# Patient Record
Sex: Female | Born: 2000 | Race: White | Hispanic: No | Marital: Single | Smoking: Current every day smoker
Health system: Southern US, Community
[De-identification: ages and names within clinical notes are randomized; demographics above are authoritative.]

## PROBLEM LIST (undated history)

## (undated) ENCOUNTER — Inpatient Hospital Stay: Payer: Self-pay

## (undated) DIAGNOSIS — Z789 Other specified health status: Secondary | ICD-10-CM

## (undated) DIAGNOSIS — T4145XA Adverse effect of unspecified anesthetic, initial encounter: Secondary | ICD-10-CM

## (undated) DIAGNOSIS — R519 Headache, unspecified: Secondary | ICD-10-CM

## (undated) DIAGNOSIS — R112 Nausea with vomiting, unspecified: Secondary | ICD-10-CM

## (undated) DIAGNOSIS — O039 Complete or unspecified spontaneous abortion without complication: Secondary | ICD-10-CM

## (undated) DIAGNOSIS — T8859XA Other complications of anesthesia, initial encounter: Secondary | ICD-10-CM

## (undated) DIAGNOSIS — K219 Gastro-esophageal reflux disease without esophagitis: Secondary | ICD-10-CM

## (undated) DIAGNOSIS — Z9889 Other specified postprocedural states: Secondary | ICD-10-CM

## (undated) DIAGNOSIS — F419 Anxiety disorder, unspecified: Secondary | ICD-10-CM

## (undated) HISTORY — PX: DILATION AND CURETTAGE OF UTERUS: SHX78

## (undated) HISTORY — PX: OTHER SURGICAL HISTORY: SHX169

## (undated) HISTORY — DX: Complete or unspecified spontaneous abortion without complication: O03.9

---
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
## 2018-04-05 ENCOUNTER — Other Ambulatory Visit: Payer: Self-pay

## 2018-04-05 ENCOUNTER — Emergency Department: Payer: Medicaid Other

## 2018-04-05 ENCOUNTER — Inpatient Hospital Stay
Admission: EM | Admit: 2018-04-05 | Discharge: 2018-04-06 | DRG: 833 | Disposition: A | Discharge status: Home / Self Care | Payer: Medicaid Other | Attending: Obstetrics and Gynecology | Admitting: Obstetrics and Gynecology

## 2018-04-05 DIAGNOSIS — M545 Low back pain: Secondary | ICD-10-CM

## 2018-04-05 DIAGNOSIS — O2301 Infections of kidney in pregnancy, first trimester: Secondary | ICD-10-CM

## 2018-04-05 DIAGNOSIS — O219 Vomiting of pregnancy, unspecified: Secondary | ICD-10-CM | POA: Diagnosis present

## 2018-04-05 DIAGNOSIS — O26891 Other specified pregnancy related conditions, first trimester: Secondary | ICD-10-CM

## 2018-04-05 DIAGNOSIS — N12 Tubulo-interstitial nephritis, not specified as acute or chronic: Secondary | ICD-10-CM | POA: Diagnosis present

## 2018-04-05 DIAGNOSIS — O99331 Smoking (tobacco) complicating pregnancy, first trimester: Secondary | ICD-10-CM | POA: Diagnosis present

## 2018-04-05 DIAGNOSIS — Z3A13 13 weeks gestation of pregnancy: Secondary | ICD-10-CM | POA: Diagnosis not present

## 2018-04-05 DIAGNOSIS — R109 Unspecified abdominal pain: Secondary | ICD-10-CM

## 2018-04-05 DIAGNOSIS — Z79899 Other long term (current) drug therapy: Secondary | ICD-10-CM

## 2018-04-05 DIAGNOSIS — O0990 Supervision of high risk pregnancy, unspecified, unspecified trimester: Secondary | ICD-10-CM

## 2018-04-05 DIAGNOSIS — O0992 Supervision of high risk pregnancy, unspecified, second trimester: Secondary | ICD-10-CM

## 2018-04-05 DIAGNOSIS — Z3A12 12 weeks gestation of pregnancy: Secondary | ICD-10-CM

## 2018-04-05 LAB — BASIC METABOLIC PANEL
Anion gap: 10 (ref 5–15)
BUN: 7 mg/dL (ref 6–20)
CO2: 22 mmol/L (ref 22–32)
Calcium: 9.2 mg/dL (ref 8.9–10.3)
Chloride: 103 mmol/L (ref 98–111)
Creatinine, Ser: 0.54 mg/dL (ref 0.44–1.00)
GFR calc Af Amer: >60 mL/min (ref >60)
GFR calc non Af Amer: >60 mL/min (ref >60)
Glucose, Bld: 116 mg/dL — ABNORMAL HIGH (ref 70–99)
POTASSIUM: 3.9 mmol/L (ref 3.5–5.1)
Sodium: 135 mmol/L (ref 135–145)

## 2018-04-05 LAB — URINALYSIS, COMPLETE (UACMP) WITH MICROSCOPIC
Bilirubin Urine: NEGATIVE
Glucose, UA: NEGATIVE mg/dL
Ketones, ur: 20 mg/dL — AB
Nitrite: POSITIVE — AB
Protein, ur: 100 mg/dL — AB
RBC / HPF: >50 RBC/hpf — ABNORMAL HIGH (ref 0–5)
Specific Gravity, Urine: 1.019 (ref 1.005–1.030)
WBC, UA: >50 WBC/hpf — ABNORMAL HIGH (ref 0–5)
pH: 5 (ref 5.0–8.0)

## 2018-04-05 LAB — TROPONIN I: Troponin I: <0.03 ng/mL (ref <0.03)

## 2018-04-05 LAB — LACTIC ACID, PLASMA: Lactic Acid, Venous: 1.2 mmol/L (ref 0.5–1.9)

## 2018-04-05 LAB — HEPATIC FUNCTION PANEL
ALT: 23 U/L (ref 0–44)
AST: 28 U/L (ref 15–41)
Albumin: 4 g/dL (ref 3.5–5.0)
Alkaline Phosphatase: 87 U/L (ref 38–126)
BILIRUBIN TOTAL: 0.6 mg/dL (ref 0.3–1.2)
Bilirubin, Direct: <0.1 mg/dL (ref 0.0–0.2)
Total Protein: 7.3 g/dL (ref 6.5–8.1)

## 2018-04-05 LAB — CBC
HCT: 37.9 % (ref 36.0–46.0)
HEMOGLOBIN: 13 g/dL (ref 12.0–15.0)
MCH: 27.5 pg (ref 26.0–34.0)
MCHC: 34.3 g/dL (ref 30.0–36.0)
MCV: 80.3 fL (ref 80.0–100.0)
Platelets: 311 10*3/uL (ref 150–400)
RBC: 4.72 MIL/uL (ref 3.87–5.11)
RDW: 14.1 % (ref 11.5–15.5)
WBC: 21.2 10*3/uL — ABNORMAL HIGH (ref 4.0–10.5)
nRBC: 0 % (ref 0.0–0.2)

## 2018-04-05 LAB — LIPASE, BLOOD: Lipase: 25 U/L (ref 11–51)

## 2018-04-05 LAB — HCG, QUANTITATIVE, PREGNANCY: hCG, Beta Chain, Quant, S: 45870 m[IU]/mL — ABNORMAL HIGH (ref <5)

## 2018-04-05 MED ORDER — SODIUM CHLORIDE 0.9 % IV SOLN
1.0000 g | INTRAVENOUS | Status: DC
  Administered 2018-04-06: 1 g via INTRAVENOUS
  Filled 2018-04-05: qty 10
  Filled 2018-04-05: qty 1

## 2018-04-05 MED ORDER — ACETAMINOPHEN 10 MG/ML IV SOLN
1000.0000 mg | Freq: Four times a day (QID) | INTRAVENOUS | Status: DC
  Administered 2018-04-05: 1000 mg via INTRAVENOUS
  Filled 2018-04-05 (×3): qty 100

## 2018-04-05 MED ORDER — MORPHINE SULFATE (PF) 4 MG/ML IV SOLN
4.0000 mg | Freq: Once | INTRAVENOUS | Status: AC
  Administered 2018-04-05: 4 mg via INTRAVENOUS
  Filled 2018-04-05: qty 1

## 2018-04-05 MED ORDER — CEPHALEXIN 500 MG PO CAPS: 500.0000 mg | ORAL_CAPSULE | Freq: Four times a day (QID) | ORAL | 0 refills | Status: DC

## 2018-04-05 MED ORDER — ACETAMINOPHEN 500 MG PO TABS
1000.0000 mg | ORAL_TABLET | Freq: Four times a day (QID) | ORAL | Status: DC | PRN
  Administered 2018-04-05 – 2018-04-06 (×3): 1000 mg via ORAL
  Filled 2018-04-05 (×3): qty 2

## 2018-04-05 MED ORDER — OXYCODONE HCL 5 MG PO TABS
5.0000 mg | ORAL_TABLET | ORAL | Status: DC | PRN
  Administered 2018-04-05: 5 mg via ORAL
  Filled 2018-04-05: qty 1

## 2018-04-05 MED ORDER — SODIUM CHLORIDE 0.9 % IV BOLUS
1000.0000 mL | Freq: Once | INTRAVENOUS | Status: AC
  Administered 2018-04-05: 1000 mL via INTRAVENOUS

## 2018-04-05 MED ORDER — PRENATAL MULTIVITAMIN CH
1.0000 | ORAL_TABLET | Freq: Every day | ORAL | Status: DC
  Administered 2018-04-05: 1 via ORAL
  Filled 2018-04-05 (×2): qty 1

## 2018-04-05 MED ORDER — SODIUM CHLORIDE 0.9% FLUSH
3.0000 mL | Freq: Two times a day (BID) | INTRAVENOUS | Status: DC
  Administered 2018-04-05 (×2): 3 mL via INTRAVENOUS

## 2018-04-05 MED ORDER — OXYCODONE HCL 5 MG PO TABS: 10.0000 mg | ORAL_TABLET | ORAL | Status: DC | PRN

## 2018-04-05 MED ORDER — SODIUM CHLORIDE 0.9 % IV SOLN
2.0000 g | Freq: Once | INTRAVENOUS | Status: AC
  Administered 2018-04-05: 2 g via INTRAVENOUS
  Filled 2018-04-05: qty 20

## 2018-04-05 MED ORDER — HYDROCODONE-ACETAMINOPHEN 5-325 MG PO TABS: 1.0000 | ORAL_TABLET | Freq: Four times a day (QID) | ORAL | 0 refills | Status: DC | PRN

## 2018-04-05 NOTE — ED Provider Notes (Signed)
IMPRESSION: Single living IUP demonstrated with estimated gestational age of [redacted] weeks and 5 days.  No acute maternal findings. IMPRESSION: Normal renal sonogram.  Ultrasound findings as dictated above.  Patient did have recurrence in her pain.  I will discuss with the hospitalist for admission.   Emily FilbertWilliams, Jonathan E, MD 04/05/18 [phone]

## 2018-04-05 NOTE — Discharge Instructions (Signed)
Today your urine test shows that you have an infection in your right kidney.  It is very easy to become quite dehydrated with this infection so make sure you drink lots of fluids throughout the day and take all of your antibiotics as prescribed.  Please make an appointment to follow-up with both [facility] as well as primary care this coming week for recheck and return to the emergency department sooner for any concerns.  It was a pleasure to take care of you today, and thank you for coming to our emergency department.  If you have any questions or concerns before leaving please ask the nurse to grab me and I'm more than happy to go through your aftercare instructions again.  If you were prescribed any opioid pain medication today such as Norco, Vicodin, Percocet, morphine, hydrocodone, or oxycodone please make sure you do not drive when you are taking this medication as it can alter your ability to drive safely.  If you have any concerns once you are home that you are not improving or are in fact getting worse before you can make it to your follow-up appointment, please do not hesitate to call 911 and come back for further evaluation.  Merrily BrittleNeil Orene Abbasi, MD  Results for orders placed or performed during the hospital encounter of 04/05/18  Basic metabolic panel  Result Value Ref Range   Sodium 135 135 - 145 mmol/L   Potassium 3.9 3.5 - 5.1 mmol/L   Chloride 103 98 - 111 mmol/L   CO2 22 22 - 32 mmol/L   Glucose, Bld 116 (H) 70 - 99 mg/dL   BUN 7 6 - 20 mg/dL   Creatinine, Ser 1.910.54 0.44 - 1.00 mg/dL   Calcium 9.2 8.9 - 47.810.3 mg/dL   GFR calc non Af Amer >60 >60 mL/min   GFR calc Af Amer >60 >60 mL/min   Anion gap 10 5 - 15  CBC  Result Value Ref Range   WBC 21.2 (H) 4.0 - 10.5 K/uL   RBC 4.72 3.87 - 5.11 MIL/uL   Hemoglobin 13.0 12.0 - 15.0 g/dL   HCT 29.537.9 62.136.0 - 30.846.0 %   MCV 80.3 80.0 - 100.0 fL   MCH 27.5 26.0 - 34.0 pg   MCHC 34.3 30.0 - 36.0 g/dL   RDW 65.714.1 84.611.5 - 96.215.5 %   Platelets 311 150 - 400 K/uL   nRBC 0.0 0.0 - 0.2 %  Troponin I - ONCE - STAT  Result Value Ref Range   Troponin I <0.03 <0.03 ng/mL  Urinalysis, Complete w Microscopic  Result Value Ref Range   Color, Urine AMBER (A) YELLOW   APPearance CLOUDY (A) CLEAR   Specific Gravity, Urine 1.019 1.005 - 1.030   pH 5.0 5.0 - 8.0   Glucose, UA NEGATIVE NEGATIVE mg/dL   Hgb urine dipstick MODERATE (A) NEGATIVE   Bilirubin Urine NEGATIVE NEGATIVE   Ketones, ur 20 (A) NEGATIVE mg/dL   Protein, ur 952100 (A) NEGATIVE mg/dL   Nitrite POSITIVE (A) NEGATIVE   Leukocytes, UA MODERATE (A) NEGATIVE   RBC / HPF >50 (H) 0 - 5 RBC/hpf   WBC, UA >50 (H) 0 - 5 WBC/hpf   Bacteria, UA RARE (A) NONE SEEN   Squamous Epithelial / LPF 6-10 0 - 5   WBC Clumps PRESENT    Mucus PRESENT    Dg Chest 2 View  Result Date: 04/05/2018 CLINICAL DATA:  Acute onset of right lower back pain radiating to the right lower  quadrant. Nausea and vomiting. Pleuritic chest pain. EXAM: CHEST - 2 VIEW COMPARISON:  None. FINDINGS: The lungs are well-aerated and clear. There is no evidence of focal opacification, pleural effusion or pneumothorax. The heart is normal in size; the mediastinal contour is within normal limits. No acute osseous abnormalities are seen. IMPRESSION: No acute cardiopulmonary process seen. Electronically Signed   By: Roanna RaiderJeffery  Chang M.D.   On: 04/05/2018 04:24

## 2018-04-05 NOTE — ED Provider Notes (Addendum)
[facility] Emergency Department Provider Note  ____________________________________________   First MD Initiated Contact with Patient 04/05/18 [phone]     (approximate)  I have reviewed the triage vital signs and the nursing notes.   HISTORY  Chief Complaint Back Pain   HPI Michelle Nelson is a 17 y.o. female who comes to the emergency department via EMS with sudden onset right low back pain radiating towards her right lower quadrant that began acutely at 8 PM last night.  She has had some nausea and is vomited once.  Mild shortness of breath.  She denies dysuria frequency or hesitancy.  Her symptoms are currently moderate in severity.  They are worse with twisting or movement.  They are somewhat improved with rest.  She recently took a home pregnancy test 2 weeks ago which was positive although she does not know how far along she is.  No history of abdominal surgeries.  She is able to keep food and water down.  No history of nephrolithiasis.  She was not taking fertility medications.   History reviewed. No pertinent past medical history.  Patient Active Problem List   Diagnosis Date Noted  . Supervision of high risk pregnancy, antepartum 04/06/2018  . Pyelonephritis 04/05/2018    Past Surgical History:  Procedure Laterality Date  . CESAREAN SECTION  02/08/2016  . DILATION AND CURETTAGE OF UTERUS      Prior to Admission medications   Medication Sig Start Date End Date Taking? Authorizing Provider  Prenatal Vit-Fe Fumarate-FA (PRENATAL MULTIVITAMIN) TABS tablet Take 1 tablet by mouth daily at 12 noon.   Yes [provider]  cephALEXin (KEFLEX) 500 MG capsule Take 1 capsule (500 mg total) by mouth 4 (four) times daily for 10 days. 04/06/18 04/16/18  Farrel Conners, CNM    Allergies Patient has no known allergies.  No family history on file.  Social History Social History   Tobacco Use  . Smoking status: Current Every Day Smoker  .  Smokeless tobacco: Never Used  Substance Use Topics  . Alcohol use: Not on file  . Drug use: Not on file    Review of Systems Constitutional: No fever/chills Eyes: No visual changes. ENT: No sore throat. Cardiovascular: Denies chest pain. Respiratory: Denies shortness of breath. Gastrointestinal: Positive for abdominal pain.  Positive for nausea, no vomiting.  No diarrhea.  No constipation. Genitourinary: Negative for dysuria. Musculoskeletal: Positive for back pain. Skin: Negative for rash. Neurological: Negative for headaches, focal weakness or numbness.   ____________________________________________   PHYSICAL EXAM:  VITAL SIGNS: ED Triage Vitals  Enc Vitals Group     BP 04/05/18 0322 101/75     Pulse Rate 04/05/18 0322 (!) 111     Resp 04/05/18 0322 18     Temp 04/05/18 0322 97.9 F (36.6 C)     Temp Source 04/05/18 0322 Oral     SpO2 04/05/18 0322 96 %     Weight 04/05/18 0323 168 lb (76.2 kg)     Height 04/05/18 0323 5\' 4"  (1.626 m)     Head Circumference --      Peak Flow --      Pain Score 04/05/18 0322 9     Pain Loc --      Pain Edu? --      Excl. in GC? --     Constitutional: Alert and oriented x4 appears obviously somewhat little although nontoxic in no diaphoresis Eyes: PERRL EOMI. Head: Atraumatic. Nose: No congestion/rhinnorhea. Mouth/Throat: No trismus  Neck: No stridor.   Cardiovascular: Tachycardic rate, regular rhythm. Grossly normal heart sounds.  Good peripheral circulation. Respiratory: Minimally increased respiratory effort.  No retractions. Lungs CTAB and moving good air Gastrointestinal: Soft nondistended she is somewhat tender in her right abdomen although with no rebound no guarding and no peritonitis.  Negative Rovsing's.  Positive costovertebral tenderness on the right.  Negative Murphy's Musculoskeletal: No lower extremity edema   Neurologic:  Normal speech and language. No gross focal neurologic deficits are appreciated. Skin:  Skin  is warm, dry and intact. No rash noted. Psychiatric: Mood and affect are normal. Speech and behavior are normal.    ____________________________________________   DIFFERENTIAL includes but not limited to  Urinary tract infection, pyelonephritis, ectopic pregnancy, appendicitis, infected stone ____________________________________________   LABS (all labs ordered are listed, but only abnormal results are displayed)  Labs Reviewed  URINE CULTURE - Abnormal; Notable for the following components:      Result Value   Culture   (*)    Value: >=100,000 COLONIES/mL ESCHERICHIA COLI SUSCEPTIBILITIES TO FOLLOW Performed at [facility], [address] [place], [place] [id number]    All other components within normal limits  BASIC METABOLIC PANEL - Abnormal; Notable for the following components:   Glucose, Bld 116 (*)    All other components within normal limits  CBC - Abnormal; Notable for the following components:   WBC 21.2 (*)    All other components within normal limits  URINALYSIS, COMPLETE (UACMP) WITH MICROSCOPIC - Abnormal; Notable for the following components:   Color, Urine AMBER (*)    APPearance CLOUDY (*)    Hgb urine dipstick MODERATE (*)    Ketones, ur 20 (*)    Protein, ur 100 (*)    Nitrite POSITIVE (*)    Leukocytes, UA MODERATE (*)    RBC / HPF >50 (*)    WBC, UA >50 (*)    Bacteria, UA RARE (*)    All other components within normal limits  HCG, QUANTITATIVE, PREGNANCY - Abnormal; Notable for the following components:   hCG, Beta Chain, Quant, S 45,870 (*)    All other components within normal limits  CBC - Abnormal; Notable for the following components:   WBC 11.0 (*)    Hemoglobin 11.8 (*)    HCT 34.8 (*)    All other components within normal limits  CULTURE, BLOOD (SINGLE)  TROPONIN I  HEPATIC FUNCTION PANEL  LIPASE, BLOOD  LACTIC ACID, PLASMA    Lab work reviewed by me is consistent with urinary tract infection.  She is pregnant.  Elevated  white count concerning for infection __________________________________________  EKG  ED ECG REPORT I, Merrily BrittleNeil Damisha Wolff, the attending physician, personally viewed and interpreted this ECG.  Date: 04/05/2018 EKG Time:  Rate: 87 Rhythm: normal sinus rhythm QRS Axis: normal Intervals: normal ST/T Wave abnormalities: normal Narrative Interpretation: no evidence of acute ischemia  ____________________________________________  RADIOLOGY  Chest x-ray reviewed by me with no acute disease ____________________________________________   PROCEDURES  Procedure(s) performed: no  Procedures  Critical Care performed: no  ____________________________________________   INITIAL IMPRESSION / ASSESSMENT AND PLAN / ED COURSE  Pertinent labs & imaging results that were available during my care of the patient were reviewed by me and considered in my medical decision making (see chart for details).   As part of my medical decision making, I reviewed the following data within the electronic MEDICAL RECORD NUMBER History obtained from family if available, nursing notes, old  chart and ekg, as well as notes from prior ED visits.  The patient comes to the emergency department uncomfortable appearing with right flank pain radiating towards her groin for the past 8 hours.  She is afebrile and clinically is not septic.  She is somewhat tender in her right abdomen although with no rebound or guarding and her tenderness is not over McBurney's.  She does have exquisite costovertebral tenderness.  My differential right now most notably would be pyelonephritis although I have to keep nephrolithiasis and appendicitis in the differential.  Pregnancy test is pending however given her positive home pregnancy test I am not comfortable CT in the patient.  I discussed with [facility] who recommended an MRI of the abdomen pelvis if I am concerned about appendicitis.  Urinalysis is pending at  this point and if it is negative I do believe she will require an MRI however if it is positive clinically we will treat her as pyelonephritis.  4 mg of IV morphine now for pain control along with a liter of fluids.  Following morphine the patient's pain is significantly improved.  Her urinalysis shows nitrites bacteria white blood cells in clumps all consistent with pyelonephritis.  She has never had resistant bacteria in the past on chart review.  2 g of ceftriaxone and urine culture now and I do anticipate outpatient management with cephalexin.  The patient verbalizes understanding and agreement with the plan.  Clinical Course as of Apr 07 2239  Thu Apr 05, 2018  0808 Patient reports her pain has come back.  I will do an ultrasound and provide further pain medicine.   [JW]    Clinical Course User Index [JW] Emily FilbertWilliams, Jonathan E, MD     ____________________________________________   FINAL CLINICAL IMPRESSION(S) / ED DIAGNOSES  Final diagnoses:  Pyelonephritis      NEW MEDICATIONS STARTED DURING THIS VISIT:  Discharge Medication List as of 04/06/2018  3:12 PM       Note:  This document was prepared using Dragon voice recognition software and may include unintentional dictation errors.    Merrily Brittleifenbark, Nalanie Winiecki, MD 04/05/18 [id number]    Merrily Brittleifenbark, Anyi Fels, MD 04/06/18 2240

## 2018-04-05 NOTE — ED Triage Notes (Addendum)
Pt arrives to ED via [facility] from home with c/o right-sided lower back pain with radiation into the abdominal RLQ x8hrs. Pt reports N/V, but denies diarrhea. No fever; denies any urinary changes or c/o's. Pt also c/o "pain when I breathe". Pt is pregnant but unsure of how far along (found out she was pregnant [redacted] weeks ago). No vaginal bleeding or pregnancy related c/o's.

## 2018-04-05 NOTE — H&P (Signed)
Obstetric H&P   Chief Complaint: Lower back pain, lower abdominal pain  Prenatal Care Provider: None  History of Present Illness: 17 y.o. G1P0 at 12w 5d by Ultrasound today in the ED, presenting for lower back pain on the right side that has been present since last night at 2000. It began to radiate to her right lower quadrant earlier this morning. It worsens when she moves around and improves somewhat with rest. She denies dysuria and suprapubic pressure. She has had nausea of pregnancy; nausea has not increased over the past day. Reports one episode of emesis, no diarrhea. She can tolerate PO food and drink. She has been afebrile. She has not been seen for any prenatal care during this pregnancy.    She notes that her pain is currently only in her right lower back following pain medications given in the ED.  Review of Systems: 10 point review of systems negative unless otherwise noted in HPI.  Past Medical History: History reviewed. No pertinent past medical history.  Past Surgical History: Past Surgical History:  Procedure Laterality Date  . DILATION AND CURETTAGE OF UTERUS      Past Obstetric History: G2P1  Family History: No family history on file.  Social History: Social History   Socioeconomic History  . Marital status: Single    Spouse name: Not on file  . Number of children: Not on file  . Years of education: Not on file  . Highest education level: Not on file  Occupational History  . Not on file  Social Needs  . Financial resource strain: Not on file  . Food insecurity:    Worry: Not on file    Inability: Not on file  . Transportation needs:    Medical: Not on file    Non-medical: Not on file  Tobacco Use  . Smoking status: Current Every Day Smoker  . Smokeless tobacco: Never Used  Substance and Sexual Activity  . Alcohol use: Not on file  . Drug use: Not on file  . Sexual activity: Not on file  Lifestyle  . Physical activity:    Days per week: Not on  file    Minutes per session: Not on file  . Stress: Not on file  Relationships  . Social connections:    Talks on phone: Not on file    Gets together: Not on file    Attends religious service: Not on file    Active member of club or organization: Not on file    Attends meetings of clubs or organizations: Not on file    Relationship status: Not on file  . Intimate partner violence:    Fear of current or ex partner: Not on file    Emotionally abused: Not on file    Physically abused: Not on file    Forced sexual activity: Not on file  Other Topics Concern  . Not on file  Social History Narrative  . Not on file    Medications: Prior to Admission medications   Medication Sig Start Date End Date Taking? Authorizing Provider  Prenatal Vit-Fe Fumarate-FA (PRENATAL MULTIVITAMIN) TABS tablet Take 1 tablet by mouth daily at 12 noon.   Yes [provider]  cephALEXin (KEFLEX) 500 MG capsule Take 1 capsule (500 mg total) by mouth 4 (four) times daily for 10 days. 04/05/18 04/15/18  Merrily Brittleifenbark, Neil, MD  HYDROcodone-acetaminophen (NORCO) 5-325 MG tablet Take 1 tablet by mouth every 6 (six) hours as needed for up to 7 doses for  severe pain. 04/05/18   Merrily Brittle, MD    Allergies: No Known Allergies  Physical Exam: Vitals: Blood pressure 110/63, pulse 62, temperature 98.1 F (36.7 C), temperature source Oral, resp. rate 18, height 5\' 4"  (1.626 m), weight 76.2 kg, last menstrual period 01/01/2018, SpO2 99 %.  FHT: 157 bpm on ultrasound  General: NAD HEENT: normocephalic, anicteric Pulmonary: No increased work of breathing, CTAB Cardiovascular: RRR, no murmurs, rubs, or gallops Abdomen: Non-tender Back: Positive CVAT on right side Genitourinary: Deferred Extremities: No edema, erythema, or tenderness Neurologic: Grossly intact Psychiatric: mood appropriate, affect full  Labs: Results for orders placed or performed during the hospital encounter of 04/05/18 (from the past  24 hour(s))  Basic metabolic panel     Status: Abnormal   Collection Time: 04/05/18  3:29 AM  Result Value Ref Range   Sodium 135 135 - 145 mmol/L   Potassium 3.9 3.5 - 5.1 mmol/L   Chloride 103 98 - 111 mmol/L   CO2 22 22 - 32 mmol/L   Glucose, Bld 116 (H) 70 - 99 mg/dL   BUN 7 6 - 20 mg/dL   Creatinine, Ser 1.61 0.44 - 1.00 mg/dL   Calcium 9.2 8.9 - 09.6 mg/dL   GFR calc non Af Amer >60 >60 mL/min   GFR calc Af Amer >60 >60 mL/min   Anion gap 10 5 - 15  CBC     Status: Abnormal   Collection Time: 04/05/18  3:29 AM  Result Value Ref Range   WBC 21.2 (H) 4.0 - 10.5 K/uL   RBC 4.72 3.87 - 5.11 MIL/uL   Hemoglobin 13.0 12.0 - 15.0 g/dL   HCT 04.5 40.9 - 81.1 %   MCV 80.3 80.0 - 100.0 fL   MCH 27.5 26.0 - 34.0 pg   MCHC 34.3 30.0 - 36.0 g/dL   RDW 91.4 78.2 - 95.6 %   Platelets 311 150 - 400 K/uL   nRBC 0.0 0.0 - 0.2 %  Troponin I - ONCE - STAT     Status: None   Collection Time: 04/05/18  3:29 AM  Result Value Ref Range   Troponin I <0.03 <0.03 ng/mL  hCG, quantitative, pregnancy     Status: Abnormal   Collection Time: 04/05/18  3:29 AM  Result Value Ref Range   hCG, Beta Chain, Quant, S 45,870 (H) <5 mIU/mL  Hepatic function panel     Status: None   Collection Time: 04/05/18  3:29 AM  Result Value Ref Range   Total Protein 7.3 6.5 - 8.1 g/dL   Albumin 4.0 3.5 - 5.0 g/dL   AST 28 15 - 41 U/L   ALT 23 0 - 44 U/L   Alkaline Phosphatase 87 38 - 126 U/L   Total Bilirubin 0.6 0.3 - 1.2 mg/dL   Bilirubin, Direct <2.1 0.0 - 0.2 mg/dL   Indirect Bilirubin NOT CALCULATED 0.3 - 0.9 mg/dL  Lipase, blood     Status: None   Collection Time: 04/05/18  3:29 AM  Result Value Ref Range   Lipase 25 11 - 51 U/L  Urinalysis, Complete w Microscopic     Status: Abnormal   Collection Time: 04/05/18  6:20 AM  Result Value Ref Range   Color, Urine AMBER (A) YELLOW   APPearance CLOUDY (A) CLEAR   Specific Gravity, Urine 1.019 1.005 - 1.030   pH 5.0 5.0 - 8.0   Glucose, UA NEGATIVE  NEGATIVE mg/dL   Hgb urine dipstick MODERATE (A) NEGATIVE  Bilirubin Urine NEGATIVE NEGATIVE   Ketones, ur 20 (A) NEGATIVE mg/dL   Protein, ur 409100 (A) NEGATIVE mg/dL   Nitrite POSITIVE (A) NEGATIVE   Leukocytes, UA MODERATE (A) NEGATIVE   RBC / HPF >50 (H) 0 - 5 RBC/hpf   WBC, UA >50 (H) 0 - 5 WBC/hpf   Bacteria, UA RARE (A) NONE SEEN   Squamous Epithelial / LPF 6-10 0 - 5   WBC Clumps PRESENT    Mucus PRESENT   Lactic acid, plasma     Status: None   Collection Time: 04/05/18  6:20 AM  Result Value Ref Range   Lactic Acid, Venous 1.2 0.5 - 1.9 mmol/L    Assessment: 17 y.o. G1P0 at 12w 5d with likely pyleonephritis. WBC elevated, UA positive for blood, nitrites, and moderate leukocytes. Renal ultrasound is normal.  Plan: 1) Admit for antibiotics and pain management. Has had one dose of ceftriaxone. Continue 1 g ceftriaxone q 24h.  2) Fetus - FHT by Doppler every day.  3) Regular diet, activity as tolerated.  5) Disposition - admit to inpatient.  Marcelyn BruinsJacelyn Schmid, CNM 04/05/2018

## 2018-04-05 NOTE — ED Notes (Signed)
Patient transported to Ultrasound 

## 2018-04-05 NOTE — ED Notes (Signed)
Pt states she is unable to provide a urine sample at this time.  

## 2018-04-06 ENCOUNTER — Encounter: Payer: Self-pay | Admitting: Certified Nurse Midwife

## 2018-04-06 DIAGNOSIS — Z3A13 13 weeks gestation of pregnancy: Secondary | ICD-10-CM

## 2018-04-06 DIAGNOSIS — O0990 Supervision of high risk pregnancy, unspecified, unspecified trimester: Secondary | ICD-10-CM

## 2018-04-06 LAB — CBC
HCT: 34.8 % — ABNORMAL LOW (ref 36.0–46.0)
Hemoglobin: 11.8 g/dL — ABNORMAL LOW (ref 12.0–15.0)
MCH: 27.8 pg (ref 26.0–34.0)
MCHC: 33.9 g/dL (ref 30.0–36.0)
MCV: 82.1 fL (ref 80.0–100.0)
Platelets: 271 10*3/uL (ref 150–400)
RBC: 4.24 MIL/uL (ref 3.87–5.11)
RDW: 14.2 % (ref 11.5–15.5)
WBC: 11 10*3/uL — ABNORMAL HIGH (ref 4.0–10.5)
nRBC: 0 % (ref 0.0–0.2)

## 2018-04-06 MED ORDER — CEPHALEXIN 500 MG PO CAPS: 500.0000 mg | ORAL_CAPSULE | Freq: Four times a day (QID) | ORAL | 0 refills | Status: AC

## 2018-04-06 NOTE — Progress Notes (Signed)
Progress Note:  HD #2  S: Feeling better. Has some pain in the right lower back with some movements, but the pain in her lower abdomen is resolved. Tolerating regular diet.She would like to be discharged.  She is 13 wk 4 days gestation today by her LMP=23 Sept 2019 and EDC=10/08/2018 which is consistent with 12wk5d ultrasound yesterday giving her EDC=10/13/2018.    O: Afebrile. BP 105/70 (BP Location: Right Arm)   Pulse 80   Temp 98 F (36.7 C) (Oral)   Resp 20   Ht 5\' 4"  (1.626 m)   Wt 76.2 kg   LMP 01/01/2018 (Approximate)   SpO2 98%   BMI 28.84 kg/m    General: WF in NAD, sitting up talking Heart: RRR without murmur Lungs: CTAB Back: no CVAT bilaterally  Urine culture: growing >100K E.coli, sensitivities pending WBC on 12/25: 21.1K  A: Right pyelonephritis: improved, pain less on Rocephin IUP at 13wk4day by LMP  P: DC home on Keflex 500 mgm tid x 10 days ([facility]) Advised [facility] next [facility]. Planning on starting prenatal care at [facility], Medicaid still pending  Farrel Connersolleen Desire Fulp, CNM

## 2018-04-06 NOTE — Progress Notes (Signed)
Dc to home.  To car via [facility] staff.  To home via taxi furnished by SW.  Inst to taxi to take across street [facility] to get antibiotic for UTI.  Pt verb u/o of f/u care and [facility].  Address given to pt.  Encouraged to make appt for prenatal care at [facility] also.

## 2018-04-06 NOTE — Progress Notes (Signed)
Talking with pt about possible d/c today.  Pt expressed no way to get home d/t current home situation.  Recently moved from [place] with boyfriend and 19 yr old daughter.  Pt is not in active relationship with her parents currently.  BF works at [employer] on [address] and pt stays home with daughter.  Has been trying to apply for Medicaid.  BF missed work yesterday d/t being in ER and hospital admit, but his employer offered him to work a double shift today so that was her transportation.  Her plan is to walk home.  Nurse notified SW for arrangements for transportation for home.  Taxi voucher received for discharge.   SW also arranged for pt to go [facility] to received antibiotic for home use, which is across street from hospital.

## 2018-04-06 NOTE — Care Management (Signed)
[facility] tubed to 3rd floor for RN to deliver to patient. Referral made to both clinics.

## 2018-04-06 NOTE — Clinical Social Work Note (Signed)
Clinical Social Work Assessment  Patient Details  Name: Michelle Nelson MRN: [id number] Date of Birth: Oct 17, 2000  Date of referral:  04/06/18               Reason for consult:  Care Management Concerns, Insurance Barriers, Transportation                Permission sought to share information with:  Case Optician, dispensing granted to share information::  Yes, Verbal Permission Granted  Name::        Agency::     Relationship::     Contact Information:     Housing/Transportation Living arrangements for the past 2 months:  Apartment Source of Information:  Patient Patient Interpreter Needed:    Criminal Activity/Legal Involvement Pertinent to Current Situation/Hospitalization:  No - Comment as needed Significant Relationships:  Significant Other Lives with:  Significant Other, Minor Children Do you feel safe going back to the place where you live?  Yes Need for family participation in patient care:  Yes (Comment)  Care giving concerns:  Patient lives in [place] in an apartment with her 2 y.o daughter and fiance Michelle Nelson.    Social Worker assessment / plan:  Holiday representative ([name]) received verbal consult from RN that patient needs a ride home today. CSW met with patient alone at bedside to address consult. Patient was alert and oriented X4 and was sitting up in the bed. Patient is [redacted] weeks pregnant and lives in [place] with her fiance and 2 y.o daughter. Per patient she and her fiance do not have car and use uber for transport to the grocery store when they have the funds. Per patient she does not have money to pay for a uber or a taxi today and her fiance is working at his 2 jobs today. Per patient she and her fiance moved to [facility] from [place] in March 19. Per patient her 2 y.o daughter does have medicaid and medicaid transport however patient does not have medicaid. Per patient she has been trying to get medicaid since March 2019. Patient reported that her mother in law is  keeping her 19 year old now while she is at [facility] and is going to take her back to [place] for a few days. CSW provided patient with a taxi voucher to get home today. Per mother she can't afford to pay for her antibiotics. RN case manager provided patient with medication management clinic application. Taxi will take patient to medication management then to her home in [place]. Patient reported that she is going to go to the health department for prenatal care. CSW will continue to follow and assist as needed.   Employment status:  Education officer, museum information:  Self Pay (Medicaid Pending) PT Recommendations:  Not assessed at this time Information / Referral to community resources:  Other (Comment Required)(Taxi Voucher)  Patient/Family's Response to care:  Patient will D/C home today.   Patient/Family's Understanding of and Emotional Response to Diagnosis, Current Treatment, and Prognosis:  Patient was very pleasant and thanked CSW for assistance.   Emotional Assessment Appearance:  Appears stated age Attitude/Demeanor/Rapport:    Affect (typically observed):  Accepting, Adaptable, Pleasant Orientation:  Oriented to Self, Oriented to Place, Oriented to  Time, Oriented to Situation Alcohol / Substance use:  Not Applicable Psych involvement (Current and /or in the community):  No (Comment)  Discharge Needs  Concerns to be addressed:    Readmission within the last 30 days:  No Current discharge risk:  Inadequate Financial  Supports Barriers to Discharge:  No Barriers Identified   Michelle Nelson, Michelle Beets, LCSW 04/06/2018, 2:44 PM

## 2018-04-06 NOTE — Final Progress Note (Signed)
Physician Final Progress Note  Patient ID: Michelle Nelson MRN: [id number] DOB/AGE: 04-13-2000 17 y.o.  Admit date: 04/05/2018 Admitting provider: Conard Novak, MD Discharge date: 04/06/2018   Admission Diagnoses: IUP at 13 weeks with right pyelonephritis  Discharge Diagnoses:  IUP at 13wk4d with right pyelonephritis Consults: None  Significant Findings/ Diagnostic Studies: 17 year old G3 P0111 who presented to the ER with LMP 23 Sept 2019 and EDC=10/08/2018 with sudden onset right lower back pain radiating towards her right lower quadrant. She was tachycardic, but afebrile and denied dysuria. She had some nausea and vomiting and there was right costovertebral tenderness on exam.   Her WBC was 21.2K and her urinalysis was remarkable for +2 protein and was positive for nitrites, WBC clumps, and >50 RBCs. She received 2 gm of IV Rocephin and 4 mgm morphine IV with some relief of her pain. A sonogram confirmed a viable singleton intrauterine pregnancy with CRL consistent with her gestational age by her LMP. A kidney ultrasound was normal. No stones were seen. Because of her recurrence of pain and her nausea and vomiting, she was admitted for IV antibiotics and management of pain.  She was continued on Rocephin. Urine culture grew out >100K E.coli. She was feeling much better after 2 doses of IV Rocephin and was able to tolerate regular diet. Her WBC dropped to 11.0K. She requested discharge on HD #2. She was discharged home on Keflex 500 mgm tid and prenatal vitamins. Follow up appointment was scheduled for next week at [facility]. She plans on starting her prenatal care at [facility] department.  Results for orders placed or performed during the hospital encounter of 04/05/18 (from the past 48 hour(s))  Basic metabolic panel     Status: Abnormal   Collection Time: 04/05/18  3:29 AM  Result Value Ref Range   Sodium 135 135 - 145 mmol/L   Potassium 3.9 3.5 - 5.1 mmol/L   Chloride 103  98 - 111 mmol/L   CO2 22 22 - 32 mmol/L   Glucose, Bld 116 (H) 70 - 99 mg/dL   BUN 7 6 - 20 mg/dL   Creatinine, Ser 4.09 0.44 - 1.00 mg/dL   Calcium 9.2 8.9 - 81.1 mg/dL   GFR calc non Af Amer >60 >60 mL/min   GFR calc Af Amer >60 >60 mL/min   Anion gap 10 5 - 15    Comment: Performed at [facility], [address] [place], [place] [address]  CBC     Status: Abnormal   Collection Time: 04/05/18  3:29 AM  Result Value Ref Range   WBC 21.2 (H) 4.0 - 10.5 K/uL   RBC 4.72 3.87 - 5.11 MIL/uL   Hemoglobin 13.0 12.0 - 15.0 g/dL   HCT 29.5 62.1 - 30.8 %   MCV 80.3 80.0 - 100.0 fL   MCH 27.5 26.0 - 34.0 pg   MCHC 34.3 30.0 - 36.0 g/dL   RDW 65.7 84.6 - 96.2 %   Platelets 311 150 - 400 K/uL   nRBC 0.0 0.0 - 0.2 %    Comment: Performed at [facility], [address] [place], [place] [address]  Troponin I - ONCE - STAT     Status: None   Collection Time: 04/05/18  3:29 AM  Result Value Ref Range   Troponin I <0.03 <0.03 ng/mL    Comment: Performed at [facility], [address] [place], [place] [address]  hCG, quantitative, pregnancy  Status: Abnormal   Collection Time: 04/05/18  3:29 AM  Result Value Ref Range   hCG, Beta Chain, Quant, S 45,870 (H) <5 mIU/mL    Comment:          GEST. AGE      CONC.  (mIU/mL)   <=1 WEEK        5 - 50     2 WEEKS       50 - 500     3 WEEKS       100 - 10,000     4 WEEKS     1,000 - 30,000     5 WEEKS     3,500 - 115,000   6-8 WEEKS     12,000 - 270,000    12 WEEKS     15,000 - 220,000        FEMALE AND NON-PREGNANT FEMALE:     LESS THAN 5 mIU/mL Performed at [facility], [address] [place], [place] [address]   Hepatic function panel     Status: None   Collection Time: 04/05/18  3:29 AM  Result Value Ref Range   Total Protein 7.3 6.5 - 8.1 g/dL   Albumin 4.0 3.5 - 5.0 g/dL   AST 28 15 - 41 U/L   ALT 23 0 - 44 U/L   Alkaline Phosphatase 87 38 - 126 U/L   Total Bilirubin 0.6 0.3 - 1.2 mg/dL    Bilirubin, Direct <2.9 0.0 - 0.2 mg/dL   Indirect Bilirubin NOT CALCULATED 0.3 - 0.9 mg/dL    Comment: Performed at [facility], [address] [place], [place] [address]  Lipase, blood     Status: None   Collection Time: 04/05/18  3:29 AM  Result Value Ref Range   Lipase 25 11 - 51 U/L    Comment: Performed at [facility], [address] [place], [place] [address]  Urinalysis, Complete w Microscopic     Status: Abnormal   Collection Time: 04/05/18  6:20 AM  Result Value Ref Range   Color, Urine AMBER (A) YELLOW    Comment: BIOCHEMICALS MAY BE AFFECTED BY COLOR   APPearance CLOUDY (A) CLEAR   Specific Gravity, Urine 1.019 1.005 - 1.030   pH 5.0 5.0 - 8.0   Glucose, UA NEGATIVE NEGATIVE mg/dL   Hgb urine dipstick MODERATE (A) NEGATIVE   Bilirubin Urine NEGATIVE NEGATIVE   Ketones, ur 20 (A) NEGATIVE mg/dL   Protein, ur 846 (A) NEGATIVE mg/dL   Nitrite POSITIVE (A) NEGATIVE   Leukocytes, UA MODERATE (A) NEGATIVE   RBC / HPF >50 (H) 0 - 5 RBC/hpf   WBC, UA >50 (H) 0 - 5 WBC/hpf   Bacteria, UA RARE (A) NONE SEEN   Squamous Epithelial / LPF 6-10 0 - 5   WBC Clumps PRESENT    Mucus PRESENT     Comment: Performed at [facility], [address] [place], [place] [address]  Lactic acid, plasma     Status: None   Collection Time: 04/05/18  6:20 AM  Result Value Ref Range   Lactic Acid, Venous 1.2 0.5 - 1.9 mmol/L    Comment: Performed at [facility], [address] [place], [place] [address]  Urine culture     Status: Abnormal (Preliminary result)   Collection Time: 04/05/18  6:20 AM  Result Value Ref Range   Specimen Description      URINE, RANDOM Performed at [facility], [address] [place], [place] [address]    Special Requests      NONE Performed at [facility], [address] [place], [place] [address]    Culture (A)     >=100,000 COLONIES/mL ESCHERICHIA COLI SUSCEPTIBILITIES TO FOLLOW Performed  at [facility], [address] [place], [place] [address]    Report Status PENDING   Blood culture (single)     Status: None (Preliminary result)   Collection Time: 04/05/18  6:33 AM  Result Value Ref Range   Specimen Description BLOOD LEFT ANTECUBITAL    Special Requests      BOTTLES DRAWN AEROBIC AND ANAEROBIC Blood Culture adequate volume   Culture      NO GROWTH 1 DAY Performed at [facility], [address] [place], [place] [address]    Report Status PENDING    Procedures: OB ultrasound and kidney ultrasound Dg Chest 2 View  Result Date: 04/05/2018 CLINICAL DATA:  Acute onset of right lower back pain radiating to the right lower quadrant. Nausea and vomiting. Pleuritic chest pain. EXAM: CHEST - 2 VIEW COMPARISON:  None. FINDINGS: The lungs are well-aerated and clear. There is no evidence of focal opacification, pleural effusion or pneumothorax. The heart is normal in size; the mediastinal contour is within normal limits. No acute osseous abnormalities are seen. IMPRESSION: No acute cardiopulmonary process seen. Electronically Signed   By: Roanna RaiderJeffery  Chang M.D.   On: 04/05/2018 04:24   [place] Ob Comp Less 14 Wks  Result Date: 04/05/2018 CLINICAL DATA:  17 year old female with abdominal pain in the 1st trimester of pregnancy. Quantitative beta HCG 45,870. EXAM: OBSTETRIC <14 WK ULTRASOUND TECHNIQUE: Transabdominal ultrasound was performed for evaluation of the gestation as well as the maternal uterus and adnexal regions. COMPARISON:  Renal ultrasound today reported separately. FINDINGS: Intrauterine gestational sac: Single Yolk sac:  Not visible Embryo:  Visible Cardiac Activity: Detected Heart Rate: 157 bpm CRL:   64 mm   12 w 5 d                  US EDC: 10/13/2018 Subchorionic hemorrhage:  None visualized. Maternal uterus/adnexae: The right ovary appears normal measuring 2.3 x 2.5 x 2.7 centimeters. The left ovary appears normal measuring 3.5 x 1.9 by 1.3 centimeters. No  pelvic free fluid. IMPRESSION: Single living IUP demonstrated with estimated gestational age of [redacted] weeks and 5 days. No acute maternal findings. Electronically Signed   By: Odessa FlemingH  Hall M.D.   On: 04/05/2018 09:02   [place] Renal  Result Date: 04/05/2018 CLINICAL DATA:  Flank pain and hematuria EXAM: RENAL / URINARY TRACT ULTRASOUND COMPLETE COMPARISON:  None. FINDINGS: Right Kidney: Renal measurements: 12.2 x 4.0 x 5.2 cm = volume: 133 mL . Echogenicity within normal limits. No mass or hydronephrosis visualized. Left Kidney: Renal measurements: 13.3 x 5.2 x 4.3 cm = volume: 157.2 mL. Echogenicity within normal limits. No mass or hydronephrosis visualized. Bladder: Appears normal for degree of bladder distention. IMPRESSION: Normal renal sonogram. Electronically Signed   By: Signa Kellaylor  Stroud M.D.   On: 04/05/2018 09:01    Discharge Condition: stable  Disposition: Discharge disposition: 01-Home or Self Care       Diet: Regular diet  Discharge Activity: Activity as tolerated  Discharge Instructions    Discharge patient   Complete by:  As directed    Discharge disposition:  01-Home or Self Care   Discharge patient date:  04/06/2018     Allergies as of 04/06/2018  No Known Allergies     Medication List    TAKE these medications   cephALEXin 500 MG capsule Commonly known as:  KEFLEX Take 1 capsule (500 mg total) by mouth 4 (four) times daily for 10 days.   prenatal multivitamin Tabs tablet Take 1 tablet by mouth daily at 12 noon.      Follow-up Information    Center, [facility]. Schedule an appointment as soon as possible for a visit on 04/10/2018.   Specialty:  General Practice Contact information: [address] [place] [address] [phone]        Farrel ConnersGutierrez, Abdias Hickam, CNM . Appointment 04/12/2018 at 3:50 PM at [facility].  Specialty:  Certified Nurse Midwife Contact information: [address] [place]  [address] [phone]             Signed: Farrel ConnersColleen Shernell Saldierna 04/06/2018, 2:55 PM

## 2018-04-07 LAB — URINE CULTURE: Culture: >=100000 — AB

## 2018-04-10 LAB — CULTURE, BLOOD (SINGLE)
Culture: NO GROWTH
Special Requests: ADEQUATE

## 2018-04-12 ENCOUNTER — Encounter: Payer: Self-pay | Admitting: Certified Nurse Midwife

## 2018-04-18 ENCOUNTER — Encounter: Payer: Self-pay | Admitting: Certified Nurse Midwife

## 2018-06-14 ENCOUNTER — Encounter: Payer: Self-pay | Admitting: Advanced Practice Midwife

## 2018-06-14 ENCOUNTER — Other Ambulatory Visit (HOSPITAL_COMMUNITY)
Admission: RE | Admit: 2018-06-14 | Discharge: 2018-06-14 | Disposition: A | Discharge status: Home / Self Care | Payer: Medicaid Other | Source: Ambulatory Visit | Attending: Certified Nurse Midwife | Admitting: Certified Nurse Midwife

## 2018-06-14 ENCOUNTER — Ambulatory Visit (INDEPENDENT_AMBULATORY_CARE_PROVIDER_SITE_OTHER): Payer: Self-pay | Admitting: Advanced Practice Midwife

## 2018-06-14 VITALS — BP 118/74 | Wt 175.0 lb

## 2018-06-14 DIAGNOSIS — Z3480 Encounter for supervision of other normal pregnancy, unspecified trimester: Secondary | ICD-10-CM | POA: Insufficient documentation

## 2018-06-14 DIAGNOSIS — O0932 Supervision of pregnancy with insufficient antenatal care, second trimester: Secondary | ICD-10-CM

## 2018-06-14 DIAGNOSIS — Z113 Encounter for screening for infections with a predominantly sexual mode of transmission: Secondary | ICD-10-CM | POA: Diagnosis present

## 2018-06-14 DIAGNOSIS — Z3A23 23 weeks gestation of pregnancy: Secondary | ICD-10-CM

## 2018-06-14 DIAGNOSIS — O0930 Supervision of pregnancy with insufficient antenatal care, unspecified trimester: Secondary | ICD-10-CM

## 2018-06-14 DIAGNOSIS — Z131 Encounter for screening for diabetes mellitus: Secondary | ICD-10-CM

## 2018-06-14 DIAGNOSIS — O0990 Supervision of high risk pregnancy, unspecified, unspecified trimester: Secondary | ICD-10-CM

## 2018-06-14 DIAGNOSIS — Z130 Encounter for screening for diseases of the blood and blood-forming organs and certain disorders involving the immune mechanism: Secondary | ICD-10-CM

## 2018-06-14 NOTE — Progress Notes (Signed)
NOB, has not had care since 12 weeks at [facility].

## 2018-06-14 NOTE — Patient Instructions (Signed)
Second Trimester of Pregnancy The second trimester is from week 14 through week 27 (months 4 through 6). The second trimester is often a time when you feel your best. Your body has adjusted to being pregnant, and you begin to feel better physically. Usually, morning sickness has lessened or quit completely, you may have more energy, and you may have an increase in appetite. The second trimester is also a time when the fetus is growing rapidly. At the end of the sixth month, the fetus is about 9 inches long and weighs about 1 pounds. You will likely begin to feel the baby move (quickening) between 16 and 20 weeks of pregnancy. Body changes during your second trimester Your body continues to go through many changes during your second trimester. The changes vary from woman to woman.  Your weight will continue to increase. You will notice your lower abdomen bulging out.  You may begin to get stretch marks on your hips, abdomen, and breasts.  You may develop headaches that can be relieved by medicines. The medicines should be approved by your health care provider.  You may urinate more often because the fetus is pressing on your bladder.  You may develop or continue to have heartburn as a result of your pregnancy.  You may develop constipation because certain hormones are causing the muscles that push waste through your intestines to slow down.  You may develop hemorrhoids or swollen, bulging veins (varicose veins).  You may have back pain. This is caused by: ? Weight gain. ? Pregnancy hormones that are relaxing the joints in your pelvis. ? A shift in weight and the muscles that support your balance.  Your breasts will continue to grow and they will continue to become tender.  Your gums may bleed and may be sensitive to brushing and flossing.  Dark spots or blotches (chloasma, mask of pregnancy) may develop on your face. This will likely fade after the baby is born.  A dark line from your  belly button to the pubic area (linea nigra) may appear. This will likely fade after the baby is born.  You may have changes in your hair. These can include thickening of your hair, rapid growth, and changes in texture. Some women also have hair loss during or after pregnancy, or hair that feels dry or thin. Your hair will most likely return to normal after your baby is born. What to expect at prenatal visits During a routine prenatal visit:  You will be weighed to make sure you and the fetus are growing normally.  Your blood pressure will be taken.  Your abdomen will be measured to track your baby's growth.  The fetal heartbeat will be listened to.  Any test results from the previous visit will be discussed. Your health care provider may ask you:  How you are feeling.  If you are feeling the baby move.  If you have had any abnormal symptoms, such as leaking fluid, bleeding, severe headaches, or abdominal cramping.  If you are using any tobacco products, including cigarettes, chewing tobacco, and electronic cigarettes.  If you have any questions. Other tests that may be performed during your second trimester include:  Blood tests that check for: ? Low iron levels (anemia). ? High blood sugar that affects pregnant women (gestational diabetes) between 24 and 28 weeks. ? Rh antibodies. This is to check for a protein on red blood cells (Rh factor).  Urine tests to check for infections, diabetes, or protein in the   urine.  An ultrasound to confirm the proper growth and development of the baby.  An amniocentesis to check for possible genetic problems.  Fetal screens for spina bifida and Down syndrome.  HIV (human immunodeficiency virus) testing. Routine prenatal testing includes screening for HIV, unless you choose not to have this test. Follow these instructions at home: Medicines  Follow your health care provider's instructions regarding medicine use. Specific medicines may be  either safe or unsafe to take during pregnancy.  Take a prenatal vitamin that contains at least 600 micrograms (mcg) of folic acid.  If you develop constipation, try taking a stool softener if your health care provider approves. Eating and drinking   Eat a balanced diet that includes fresh fruits and vegetables, whole grains, good sources of protein such as meat, eggs, or tofu, and low-fat dairy. Your health care provider will help you determine the amount of weight gain that is right for you.  Avoid raw meat and uncooked cheese. These carry germs that can cause birth defects in the baby.  If you have low calcium intake from food, talk to your health care provider about whether you should take a daily calcium supplement.  Limit foods that are high in fat and processed sugars, such as fried and sweet foods.  To prevent constipation: ? Drink enough fluid to keep your urine clear or pale yellow. ? Eat foods that are high in fiber, such as fresh fruits and vegetables, whole grains, and beans. Activity  Exercise only as directed by your health care provider. Most women can continue their usual exercise routine during pregnancy. Try to exercise for 30 minutes at least 5 days a week. Stop exercising if you experience uterine contractions.  Avoid heavy lifting, wear low heel shoes, and practice good posture.  A sexual relationship may be continued unless your health care provider directs you otherwise. Relieving pain and discomfort  Wear a good support bra to prevent discomfort from breast tenderness.  Take warm sitz baths to soothe any pain or discomfort caused by hemorrhoids. Use hemorrhoid cream if your health care provider approves.  Rest with your legs elevated if you have leg cramps or low back pain.  If you develop varicose veins, wear support hose. Elevate your feet for 15 minutes, 3-4 times a day. Limit salt in your diet. Prenatal Care  Write down your questions. Take them to  your prenatal visits.  Keep all your prenatal visits as told by your health care provider. This is important. Safety  Wear your seat belt at all times when driving.  Make a list of emergency phone numbers, including numbers for family, friends, the hospital, and police and fire departments. General instructions  Ask your health care provider for a referral to a local prenatal education class. Begin classes no later than the beginning of month 6 of your pregnancy.  Ask for help if you have counseling or nutritional needs during pregnancy. Your health care provider can offer advice or refer you to specialists for help with various needs.  Do not use hot tubs, steam rooms, or saunas.  Do not douche or use tampons or scented sanitary pads.  Do not cross your legs for long periods of time.  Avoid cat litter boxes and soil used by cats. These carry germs that can cause birth defects in the baby and possibly loss of the fetus by miscarriage or stillbirth.  Avoid all smoking, herbs, alcohol, and unprescribed drugs. Chemicals in these products can affect the formation   and growth of the baby. °· Do not use any products that contain nicotine or tobacco, such as cigarettes and e-cigarettes. If you need help quitting, ask your health care provider. °· Visit your dentist if you have not gone yet during your pregnancy. Use a soft toothbrush to brush your teeth and be gentle when you floss. °Contact a health care provider if: °· You have dizziness. °· You have mild pelvic cramps, pelvic pressure, or nagging pain in the abdominal area. °· You have persistent nausea, vomiting, or diarrhea. °· You have a bad smelling vaginal discharge. °· You have pain when you urinate. °Get help right away if: °· You have a fever. °· You are leaking fluid from your vagina. °· You have spotting or bleeding from your vagina. °· You have severe abdominal cramping or pain. °· You have rapid weight gain or weight loss. °· You have  shortness of breath with chest pain. °· You notice sudden or extreme swelling of your face, hands, ankles, feet, or legs. °· You have not felt your baby move in over an hour. °· You have severe headaches that do not go away when you take medicine. °· You have vision changes. °Summary °· The second trimester is from week 14 through week 27 (months 4 through 6). It is also a time when the fetus is growing rapidly. °· Your body goes through many changes during pregnancy. The changes vary from woman to woman. °· Avoid all smoking, herbs, alcohol, and unprescribed drugs. These chemicals affect the formation and growth your baby. °· Do not use any tobacco products, such as cigarettes, chewing tobacco, and e-cigarettes. If you need help quitting, ask your health care provider. °· Contact your health care provider if you have any questions. Keep all prenatal visits as told by your health care provider. This is important. °This information is not intended to replace advice given to you by your health care provider. Make sure you discuss any questions you have with your health care provider. °Document Released: 03/22/2001 Document Revised: 05/03/2016 Document Reviewed: 05/03/2016 °[employer] Interactive Patient Education © 2019 [employer] °Exercise During Pregnancy °For people of all ages, exercise is an important part of being healthy. Exercise improves heart and lung function and helps to maintain strength, flexibility, and a healthy body weight. Exercise also boosts energy levels and elevates mood. °For most women, maintaining an exercise routine throughout pregnancy is recommended. It is only on rare occasions and with certain medical conditions or pregnancy complications that women may be asked to limit or avoid exercise during pregnancy. °What are some other benefits to exercising during pregnancy? °Along with maintaining strength and flexibility, exercising throughout pregnancy can help to: °· Keep strength in muscles  that are very important during labor and childbirth. °· Decrease low back pain during pregnancy. °· Decrease the risk of developing gestational diabetes mellitus (GDM). °· Improve blood sugar (glucose) control for women who have GDM. °· Decrease the risk of developing preeclampsia. This is a serious condition that causes high blood pressure along with other symptoms, such as swelling and headaches. °· Decrease the risk of cesarean delivery. °· Speed up the recovery after giving birth. °How often should I exercise? °Unless your health care provider gives you different instructions, you should try to exercise on most days or all days of the week. In general, try to exercise with moderate intensity for about 150 minutes per week. This can be spread out across several days, such as exercising for 30 minutes   per day on 5 days of each week. You can tell that you are exercising at a moderate intensity if you have a higher heart rate and faster breathing, but you are still able to hold a conversation. °What types of moderate-intensity exercise are recommended during pregnancy? °There are many types of exercise that are safe for you to do during pregnancy. Unless your health care provider gives you different instructions, do a variety of exercises that safely increase your heart and breathing (cardiopulmonary) rates and help you to build and maintain muscle strength (strength training). You should always be able to talk in full sentences while exercising during pregnancy. °Some examples of exercising that is safe to do during pregnancy include: °· Brisk walking or hiking. °· Swimming. °· Water aerobics. °· Riding a stationary bike. °· Strength training. °· Modified yoga or Pilates. Tell your instructor that you are pregnant. Avoid overstretching and avoid lying on your back for long periods of time. °· Running or jogging. Only choose this type of exercise if: °? You ran or jogged regularly before your pregnancy. °? You can  run or jog and still talk in complete sentences. °What types of exercise should I not do during pregnancy? °Depending on your level of fitness and whether you exercised regularly before your pregnancy, you may be advised to limit vigorous-intensity exercise during your pregnancy. You can tell that you are exercising at a vigorous intensity if you are breathing much harder and faster and cannot hold a conversation while exercising. °Some examples of exercising that you should avoid during pregnancy include: °· Contact sports. °· Activities that place you at risk for falling on or being hit in the belly, such as downhill skiing, water skiing, surfing, rock climbing, cycling, gymnastics, and horseback riding. °· Scuba diving. °· Sky diving. °· Yoga or Pilates in a room that is heated to extreme temperatures ("hot yoga" or "hot Pilates"). °· Jogging or running, unless you ran or jogged regularly before your pregnancy. While jogging or running, you should always be able to talk in full sentences. Do not run or jog so vigorously that you are unable to have a conversation. °· If you are not used to exercising at elevation (more than 6,000 feet above sea level), do not do so during your pregnancy. °When should I avoid exercising during pregnancy? °Certain medical conditions can make it unsafe to exercise during pregnancy, or they may increase your risk of miscarriage or early labor and birth. Some of these conditions include: °· Some types of heart disease. °· Some types of lung disease. °· Placenta previa. This is when the placenta partially or completely covers the opening of the uterus (cervix). °· Frequent bleeding from the vagina during your pregnancy. °· Incompetent cervix. This is when your cervix does not remain as tightly closed during pregnancy as it should. °· Premature labor. °· Ruptured membranes. This is when the protective sac (amniotic sac) opens up and amniotic fluid leaks from your vagina. °· Severely low  blood count (anemia). °· Preeclampsia or pregnancy-caused high blood pressure. °· Carrying more than one baby (multiple gestation) and having an additional risk of early labor. °· Poorly controlled diabetes. °· Being severely underweight or severely overweight. °· Intrauterine growth restriction. This is when your baby's growth and development during pregnancy are slower than expected. °· Other medical conditions. Ask your health care provider if any apply to you. °What else should I know about exercising during pregnancy? °You should take these precautions while exercising during   pregnancy: °· Avoid overheating. °? Wear loose-fitting, breathable clothes. °? Do not exercise in very high temperatures. °· Avoid dehydration. Drink enough water before, during, and after exercise to keep your urine clear or pale yellow. °· Avoid overstretching. Because of hormone changes during pregnancy, it is easy to overstretch muscles, tendons, and ligaments during pregnancy. °· Start slowly and ask your health care provider to recommend types of exercise that are safe for you, if exercising regularly is new for you. °Pregnancy is not a time for exercising to lose weight. °When should I seek medical care? °You should stop exercising and call your health care provider if you have any unusual symptoms, such as: °· Mild uterine contractions or abdominal cramping. °· Dizziness that does not improve with rest. °When should I seek immediate medical care? °You should stop exercising and call your local emergency services (911 in the [place] if you have any unusual symptoms, such as: °· Sudden, severe pain in your low back or your belly. °· Uterine contractions or abdominal cramping that do not improve with rest. °· Chest pain. °· Bleeding or fluid leaking from your vagina. °· Shortness of breath. °This information is not intended to replace advice given to you by your health care provider. Make sure you discuss any questions you have with  your health care provider. °Document Released: 03/28/2005 Document Revised: 08/26/2015 Document Reviewed: 06/05/2014 °[employer] Interactive Patient Education © 2019 [employer] °Eating Plan for Pregnant Women °While you are pregnant, your body requires additional nutrition to help support your growing baby. You also have a higher need for some vitamins and minerals, such as folic acid, calcium, iron, and vitamin D. Eating a healthy, well-balanced diet is very important for your health and your baby's health. Your need for extra calories varies for the three 3-month segments of your pregnancy (trimesters). For most women, it is recommended to consume: °· 150 extra calories a day during the first trimester. °· 300 extra calories a day during the second trimester. °· 300 extra calories a day during the third trimester. °What are tips for following this plan? ° °· Do not try to lose weight or go on a diet during pregnancy. °· Limit your overall intake of foods that have "empty calories." These are foods that have little nutritional value, such as sweets, desserts, candies, and sugar-sweetened beverages. °· Eat a variety of foods (especially fruits and vegetables) to get a full range of vitamins and minerals. °· Take a prenatal vitamin to help meet your additional vitamin and mineral needs during pregnancy, specifically for folic acid, iron, calcium, and vitamin D. °· Remember to stay active. Ask your health care provider what types of exercise and activities are safe for you. °· Practice good food safety and cleanliness. Wash your hands before you eat and after you prepare raw meat. Wash all fruits and vegetables well before peeling or eating. Taking these actions can help to prevent food-borne illnesses that can be very dangerous to your baby, such as listeriosis. Ask your health care provider for more information about listeriosis. °What does 150 extra calories look like? °Healthy options that provide 150 extra  calories each day could be any of the following: °· 6-8 oz (170-230 g) of plain low-fat yogurt with ½ cup of berries. °· 1 apple with 2 teaspoons (11 g) of peanut butter. °· Cut-up vegetables with ¼ cup (60 g) of hummus. °· 8 oz (230 mL) or 1 cup of low-fat chocolate milk. °· 1 stick of string   cheese with 1 medium orange. °· 1 peanut butter and jelly sandwich that is made with one slice of whole-wheat bread and 1 tsp (5 g) of peanut butter. °For 300 extra calories, you could eat two of those healthy options each day. °What is a healthy amount of weight to gain? °The right amount of weight gain for you is based on your BMI before you became pregnant. If your BMI: °· Was less than 18 (underweight), you should gain 28-40 lb (13-18 kg). °· Was 18-24.9 (normal), you should gain 25-35 lb (11-16 kg). °· Was 25-29.9 (overweight), you should gain 15-25 lb (7-11 kg). °· Was 30 or greater (obese), you should gain 11-20 lb (5-9 kg). °What if I am having twins or multiples? °Generally, if you are carrying twins or multiples: °· You may need to eat 300-600 extra calories a day. °· The recommended range for total weight gain is 25-54 lb (11-25 kg), depending on your BMI before pregnancy. °· Talk with your health care provider to find out about nutritional needs, weight gain, and exercise that is right for you. °What foods can I eat? ° °Grains °All grains. Choose whole grains, such as whole-wheat bread, oatmeal, or brown rice. °Vegetables °All vegetables. Eat a variety of colors and types of vegetables. Remember to wash your vegetables well before peeling or eating. °Fruits °All fruits. Eat a variety of colors and types of fruit. Remember to wash your fruits well before peeling or eating. °Meats and other protein foods °Lean meats, including chicken, turkey, fish, and lean cuts of beef, veal, or pork. If you eat fish or seafood, choose options that are higher in omega-3 fatty acids and lower in mercury, such as salmon, herring,  mussels, trout, sardines, pollock, shrimp, crab, and lobster. Tofu. Tempeh. Beans. Eggs. Peanut butter and other nut butters. Make sure that all meats, poultry, and eggs are cooked to food-safe temperatures or "well-done." °Two or more servings of fish are recommended each week in order to get the most benefits from omega-3 fatty acids that are found in seafood. Choose fish that are lower in mercury. You can find more information online: °· www.fda.gov °Dairy °Pasteurized milk and milk alternatives (such as almond milk). Pasteurized yogurt and pasteurized cheese. Cottage cheese. Sour cream. °Beverages °Water. Juices that contain 100% fruit juice or vegetable juice. Caffeine-free teas and decaffeinated coffee. °Drinks that contain caffeine are okay to drink, but it is better to avoid caffeine. Keep your total caffeine intake to less than 200 mg each day (which is 12 oz or 355 mL of coffee, tea, or soda) or the limit as told by your health care provider. °Fats and oils °Fats and oils are okay to include in moderation. °Sweets and desserts °Sweets and desserts are okay to include in moderation. °Seasoning and other foods °All pasteurized condiments. °The items listed above may not be a complete list of recommended foods and beverages. Contact your dietitian for more options. °What foods are not recommended? °Vegetables °Raw (unpasteurized) vegetable juices. °Fruits °Unpasteurized fruit juices. °Meats and other protein foods °Lunch meats, bologna, hot dogs, or other deli meats. (If you must eat those meats, reheat them until they are steaming hot.) Refrigerated paté, meat spreads from a meat counter, smoked seafood that is found in the refrigerated section of a store. Raw or undercooked meats, poultry, and eggs. Raw fish, such as sushi or sashimi. Fish that have high mercury content, such as tilefish, shark, swordfish, and king mackerel. °To learn more about mercury in   fish, talk with your health care provider or look  for online resources, such as: °· www.fda.gov °Dairy °Raw (unpasteurized) milk and any foods that have raw milk in them. Soft cheeses, such as feta, queso blanco, queso fresco, Brie, Camembert cheeses, blue-veined cheeses, and Panela cheese (unless it is made with pasteurized milk, which must be stated on the label). °Beverages °Alcohol. Sugar-sweetened beverages, such as sodas, teas, or energy drinks. °Seasoning and other foods °Homemade fermented foods and drinks, such as pickles, sauerkraut, or kombucha drinks. (Store-bought pasteurized versions of these are okay.) °Salads that are made in a store or deli, such as ham salad, chicken salad, egg salad, tuna salad, and seafood salad. °The items listed above may not be a complete list of foods and beverages to avoid. Contact your dietitian for more information. °Where to find more information °To calculate the number of calories you need based on your height, weight, and activity level, you can use an online calculator such as: °· www.choosemyplate.gov/MyPlatePlan °To calculate how much weight you should gain during pregnancy, you can use an online pregnancy weight gain calculator such as: °· www.choosemyplate.gov/pregnancy-weight-gain-calculator °Summary °· While you are pregnant, your body requires additional nutrition to help support your growing baby. °· Eat a variety of foods, especially fruits and vegetables to get a full range of vitamins and minerals. °· Practice good food safety and cleanliness. Wash your hands before you eat and after you prepare raw meat. Wash all fruits and vegetables well before peeling or eating. Taking these actions can help to prevent food-borne illnesses, such as listeriosis, that can be very dangerous to your baby. °· Do not eat raw meat or fish. Do not eat fish that have high mercury content, such as tilefish, shark, swordfish, and king mackerel. Do not eat unpasteurized (raw) dairy. °· Take a prenatal vitamin to help meet your  additional vitamin and mineral needs during pregnancy, specifically for folic acid, iron, calcium, and vitamin D. °This information is not intended to replace advice given to you by your health care provider. Make sure you discuss any questions you have with your health care provider. °Document Released: 01/10/2014 Document Revised: 12/23/2016 Document Reviewed: 12/23/2016 °[employer] Interactive Patient Education © 2019 [employer] °Prenatal Care °Prenatal care is health care during pregnancy. It helps you and your unborn baby (fetus) stay as healthy as possible. Prenatal care may be provided by a midwife, a family practice health care provider, or a childbirth and pregnancy specialist (obstetrician). °How does this affect me? °During pregnancy, you will be closely monitored for any new conditions that might develop. To lower your risk of pregnancy complications, you and your health care provider will talk about any underlying conditions you have. °How does this affect my baby? °Early and consistent prenatal care increases the chance that your baby will be healthy during pregnancy. Prenatal care lowers the risk that your baby will be: °· Born early (prematurely). °· Smaller than expected at birth (small for gestational age). °What can I expect at the first prenatal care visit? °Your first prenatal care visit will likely be the longest. You should schedule your first prenatal care visit as soon as you know that you are pregnant. Your first visit is a good time to talk about any questions or concerns you have about pregnancy. At your visit, you and your health care provider will talk about: °· Your medical history, including: °? Any past pregnancies. °? Your family's medical history. °? The baby's father's medical history. °? Any long-term (  chronic) health conditions you have and how you manage them. °? Any surgeries or procedures you have had. °? Any current over-the-counter or prescription medicines, herbs, or  supplements you are taking. °· Other factors that could pose a risk to your baby, including: °· Your home setting and your stress levels, including: °? Exposure to abuse or violence. °? Household financial strain. °? Mental health conditions you have. °· Your daily health habits, including diet and exercise. °Your health care provider will also: °· Measure your weight, height, and blood pressure. °· Do a physical exam, including a pelvic and breast exam. °· Perform blood tests and urine tests to check for: °? Urinary tract infection. °? Sexually transmitted infections (STIs). °? Low iron levels in your blood (anemia). °? Blood type and certain proteins on red blood cells (Rh antibodies). °? Infections and immunity to viruses, such as hepatitis B and rubella. °? HIV (human immunodeficiency virus). °· Do an ultrasound to confirm your baby's growth and development and to help predict your estimated due date (EDD). This ultrasound is done with a probe that is inserted into the vagina (transvaginal ultrasound). °· Discuss your options for genetic screening. °· Give you information about how to keep yourself and your baby healthy, including: °? Nutrition and taking vitamins. °? Physical activity. °? How to manage pregnancy symptoms such as nausea and vomiting (morning sickness). °? Infections and substances that may be harmful to your baby and how to avoid them. °? Food safety. °? Dental care. °? Working. °? Travel. °? Warning signs to watch for and when to call your health care provider. °How often will I have prenatal care visits? °After your first prenatal care visit, you will have regular visits throughout your pregnancy. The visit schedule is often as follows: °· Up to week 28 of pregnancy: once every 4 weeks. °· 28-36 weeks: once every 2 weeks. °· After 36 weeks: every week until delivery. °Some women may have visits more or less often depending on any underlying health conditions and the health of the baby. °Keep  all follow-up and prenatal care visits as told by your health care provider. This is important. °What happens during routine prenatal care visits? °Your health care provider will: °· Measure your weight and blood pressure. °· Check for fetal heart sounds. °· Measure the height of your uterus in your abdomen (fundal height). This may be measured starting around week 20 of pregnancy. °· Check the position of your baby inside your uterus. °· Ask questions about your diet, sleeping patterns, and whether you can feel the baby move. °· Review warning signs to watch for and signs of labor. °· Ask about any pregnancy symptoms you are having and how you are dealing with them. Symptoms may include: °? Headaches. °? Nausea and vomiting. °? Vaginal discharge. °? Swelling. °? Fatigue. °? Constipation. °? Any discomfort, including back or pelvic pain. °Make a list of questions to ask your health care provider at your routine visits. °What tests might I have during prenatal care visits? °You may have blood, urine, and imaging tests throughout your pregnancy, such as: °· Urine tests to check for glucose, protein, or signs of infection. °· Glucose tests to check for a form of diabetes that can develop during pregnancy (gestational diabetes mellitus). This is usually done around week 24 of pregnancy. °· An ultrasound to check your baby's growth and development and to check for birth defects. This is usually done around week 20 of pregnancy. °· A test   to check for group B strep (GBS) infection. This is usually done around week 36 of pregnancy. °· Genetic testing. This may include blood or imaging tests, such as an ultrasound. Some genetic tests are done during the first trimester and some are done during the second trimester. °What else can I expect during prenatal care visits? °Your health care provider may recommend getting certain vaccines during pregnancy. These may include: °· A yearly flu shot (annual influenza vaccine). This is  especially important if you will be pregnant during flu season. °· Tdap (tetanus, diphtheria, pertussis) vaccine. Getting this vaccine during pregnancy can protect your baby from whooping cough (pertussis) after birth. This vaccine may be recommended between weeks 27 and 36 of pregnancy. °Later in your pregnancy, your health care provider may give you information about: °· Childbirth and breastfeeding classes. °· Choosing a health care provider for your baby. °· Umbilical cord banking. °· Breastfeeding. °· Birth control after your baby is born. °· The hospital labor and delivery unit and how to tour it. °· Registering at the hospital before you go into labor. °Where to find more information °· Office on Women's Health: womenshealth.gov °· American Pregnancy Association: americanpregnancy.org °· March of Dimes: marchofdimes.org °Summary °· Prenatal care helps you and your baby stay as healthy as possible during pregnancy. °· Your first prenatal care visit will most likely be the longest. °· You will have visits and tests throughout your pregnancy to monitor your health and your baby's health. °· Bring a list of questions to your visits to ask your health care provider. °· Make sure to keep all follow-up and prenatal care visits with your health care provider. °This information is not intended to replace advice given to you by your health care provider. Make sure you discuss any questions you have with your health care provider. °Document Released: 03/31/2003 Document Revised: 03/27/2017 Document Reviewed: 03/27/2017 °Elsevier Interactive Patient Education © 2019 [employer]vier Inc. ° °

## 2018-06-15 DIAGNOSIS — O0930 Supervision of pregnancy with insufficient antenatal care, unspecified trimester: Secondary | ICD-10-CM | POA: Insufficient documentation

## 2018-06-15 NOTE — Progress Notes (Signed)
New Obstetric Patient H&P    Chief Complaint: "Desires prenatal care"   History of Present Illness: Patient is a 18 y.o. [id number] Not Hispanic or Latino female, presents with amenorrhea and positive home pregnancy test. Patient's last menstrual period was 01/01/2018 (approximate). and based on her  LMP, her EDD is Estimated Date of Delivery: 10/08/18 and her EGA is [redacted]w[redacted]d. Cycles are 7. days, regular, and occur approximately every : 28 days. She has never had a PAP smear.   She had a urine pregnancy test which was positive in November. She was seen in the ED in December for pyelonephritis. Her last menstrual period was normal and lasted for  7 day(s). Since her LMP she claims she has experienced breast tenderness, fatigue, nausea, vomiting, frequent urination. She denies vaginal bleeding. Her past medical history is noncontributory. Her prior pregnancies are notable for SAB with D&C, C/S at 35 weeks for fetal distress (Newborn with known gastroschesis)  Since her LMP, she admits to the use of tobacco products  Yes she smokes 2-3 cigarettes per day and is trying to quit She claims she has gained   10 pounds since the start of her pregnancy.  There are cats in the home in the home  no  She admits close contact with children on a regular basis  yes  She has had chicken pox in the past unknown She has had Tuberculosis exposures, symptoms, or previously tested positive for TB   no Current or past history of domestic violence. no  Genetic Screening/Teratology Counseling: (Includes patient, baby's father, or anyone in either family with:)   1. Patient's age >/= 80 at [facility]  no 2. Thalassemia ([place], [place], Mediterranean, or Asian background): MCV<80  no 3. Neural tube defect (meningomyelocele, spina bifida, anencephaly)  no 4. Congenital heart defect  no  5. Down syndrome  Patient's brother with DS 6. Tay-Sachs (Jewish, [place]  no 7. Canavan's Disease  no 8. Sickle cell disease or  trait (African)  no  9. Hemophilia or other blood disorders  no  10. Muscular dystrophy  no  11. Cystic fibrosis  no  12. Huntington's Chorea  no  13. Mental retardation/autism  FOB's uncle with MR 14. Other inherited genetic or chromosomal disorder  no 15. Maternal metabolic disorder (DM, PKU, etc)  no 16. Patient or FOB with a child with a birth defect not listed above no  16a. Patient or FOB with a birth defect themselves no 17. Recurrent pregnancy loss, or stillbirth  no  18. Any medications since LMP other than prenatal vitamins (include vitamins, supplements, OTC meds, drugs, alcohol)  no 19. Any other genetic/environmental exposure to discuss  no  Infection History:   1. Lives with someone with TB or TB exposed  no  2. Patient or partner has history of genital herpes  no 3. Rash or viral illness since LMP  no 4. History of STI (GC, CT, HPV, syphilis, HIV)  no 5. History of recent travel :  no  Other pertinent information:  Late entry to care     Review of Systems:10 point review of systems negative unless otherwise noted in HPI  Past Medical History:  Past Medical History:  Diagnosis Date  . Miscarriage     Past Surgical History:  Past Surgical History:  Procedure Laterality Date  . CESAREAN SECTION  02/08/2016  . DILATION AND CURETTAGE OF UTERUS      Gynecologic History: Patient's last menstrual period was 01/01/2018 (approximate).  Obstetric  History: [id number]  Family History:  Family History  Problem Relation Age of Onset  . Heart disease Paternal Grandmother     Social History:  Social History   Socioeconomic History  . Marital status: Single    Spouse name: Not on file  . Number of children: Not on file  . Years of education: Not on file  . Highest education level: Not on file  Occupational History  . Not on file  Social Needs  . Financial resource strain: Not on file  . Food insecurity:    Worry: Not on file    Inability: Not on file  .  Transportation needs:    Medical: Not on file    Non-medical: Not on file  Tobacco Use  . Smoking status: Current Every Day Smoker  . Smokeless tobacco: Never Used  Substance and Sexual Activity  . Alcohol use: Not Currently  . Drug use: Not Currently  . Sexual activity: Yes    Birth control/protection: None  Lifestyle  . Physical activity:    Days per week: Not on file    Minutes per session: Not on file  . Stress: Not on file  Relationships  . Social connections:    Talks on phone: Not on file    Gets together: Not on file    Attends religious service: Not on file    Active member of club or organization: Not on file    Attends meetings of clubs or organizations: Not on file    Relationship status: Not on file  . Intimate partner violence:    Fear of current or ex partner: Not on file    Emotionally abused: Not on file    Physically abused: Not on file    Forced sexual activity: Not on file  Other Topics Concern  . Not on file  Social History Narrative  . Not on file    Allergies:  No Known Allergies  Medications: Prior to Admission medications   Medication Sig Start Date End Date Taking? Authorizing Provider  Prenatal Vit-Fe Fumarate-FA (PRENATAL MULTIVITAMIN) TABS tablet Take 1 tablet by mouth daily at 12 noon.   Yes [provider]    Physical Exam Vitals: Blood pressure 118/74, weight 175 lb (79.4 kg), last menstrual period 01/01/2018.  General: NAD HEENT: normocephalic, anicteric Thyroid: no enlargement, no palpable nodules Pulmonary: No increased work of breathing, CTAB Cardiovascular: RRR, distal pulses 2+ Abdomen: NABS, soft, non-tender, non-distended.  Umbilicus without lesions.  No hepatomegaly, splenomegaly or masses palpable. No evidence of hernia, FHTs 140s  Genitourinary: deferred for no concerns/No PAP/abdominal FHTs Extremities: no edema, erythema, or tenderness Neurologic: Grossly intact Psychiatric: mood appropriate, affect  full   Assessment: 18 y.o. [id number] at [redacted]w[redacted]d by LMP presenting to initiate prenatal care  Plan: 1) Avoid alcoholic beverages. 2) Patient encouraged not to smoke.  3) Discontinue the use of all non-medicinal drugs and chemicals.  4) Take prenatal vitamins daily.  5) Nutrition, food safety (fish, cheese advisories, and high nitrite foods) and exercise discussed. 6) Hospital and practice style discussed with cross coverage system.  7) Genetic Screening, such as with 1st Trimester Screening, cell free fetal DNA, AFP testing, and Ultrasound, as well as with amniocentesis and CVS as appropriate, is discussed with patient. At the conclusion of today's visit patient requested cell free DNA genetic testing 8) Patient is asked about travel to areas at risk for the Zika virus, and counseled to avoid travel and exposure to mosquitoes or sexual partners who  may have themselves been exposed to the virus. Testing is discussed, and will be ordered as appropriate.  9) Urine aptima and culture today 10) Anatomy scan at [facility] and RTC in 1 week for ROB   Tresea Mall, CNM [facility], [facility] 06/15/2018, 8:29 AM

## 2018-06-16 LAB — URINE CULTURE: Organism ID, Bacteria: NO GROWTH

## 2018-06-18 LAB — CERVICOVAGINAL ANCILLARY ONLY
CHLAMYDIA, DNA PROBE: NEGATIVE
Neisseria Gonorrhea: NEGATIVE
Trichomonas: NEGATIVE

## 2018-06-25 ENCOUNTER — Other Ambulatory Visit: Payer: Self-pay

## 2018-06-25 ENCOUNTER — Ambulatory Visit
Admission: RE | Admit: 2018-06-25 | Discharge: 2018-06-25 | Disposition: A | Discharge status: Home / Self Care | Payer: Medicaid Other | Source: Ambulatory Visit | Attending: Advanced Practice Midwife | Admitting: Advanced Practice Midwife

## 2018-06-25 DIAGNOSIS — Z3480 Encounter for supervision of other normal pregnancy, unspecified trimester: Secondary | ICD-10-CM | POA: Insufficient documentation

## 2018-06-29 ENCOUNTER — Telehealth: Payer: Self-pay

## 2018-06-29 NOTE — Telephone Encounter (Signed)
Spoke with patient and discussed results of ultrasound. She will need follow up for spine and nuchal region.

## 2018-06-29 NOTE — Telephone Encounter (Signed)
Pt calling for u/s results from Monday.  [phone]

## 2018-07-05 NOTE — Discharge Summary (Signed)
See final progress note completed by Farrel Conners, CNM.

## 2018-07-12 ENCOUNTER — Encounter: Payer: Self-pay | Admitting: Maternal Newborn

## 2018-07-12 ENCOUNTER — Other Ambulatory Visit: Payer: Self-pay

## 2018-07-26 ENCOUNTER — Encounter: Payer: Self-pay | Admitting: Maternal Newborn

## 2018-07-26 ENCOUNTER — Other Ambulatory Visit: Payer: Self-pay

## 2018-08-01 ENCOUNTER — Other Ambulatory Visit: Payer: Self-pay

## 2018-08-01 ENCOUNTER — Other Ambulatory Visit: Payer: Medicaid Other

## 2018-08-01 ENCOUNTER — Ambulatory Visit (INDEPENDENT_AMBULATORY_CARE_PROVIDER_SITE_OTHER): Payer: Medicaid Other | Admitting: Obstetrics & Gynecology

## 2018-08-01 VITALS — BP 120/70 | Wt 185.0 lb

## 2018-08-01 DIAGNOSIS — Z113 Encounter for screening for infections with a predominantly sexual mode of transmission: Secondary | ICD-10-CM

## 2018-08-01 DIAGNOSIS — Z23 Encounter for immunization: Secondary | ICD-10-CM

## 2018-08-01 DIAGNOSIS — O0930 Supervision of pregnancy with insufficient antenatal care, unspecified trimester: Secondary | ICD-10-CM

## 2018-08-01 DIAGNOSIS — Z3A30 30 weeks gestation of pregnancy: Secondary | ICD-10-CM

## 2018-08-01 DIAGNOSIS — Z130 Encounter for screening for diseases of the blood and blood-forming organs and certain disorders involving the immune mechanism: Secondary | ICD-10-CM

## 2018-08-01 DIAGNOSIS — O0933 Supervision of pregnancy with insufficient antenatal care, third trimester: Secondary | ICD-10-CM

## 2018-08-01 DIAGNOSIS — Z131 Encounter for screening for diabetes mellitus: Secondary | ICD-10-CM

## 2018-08-01 DIAGNOSIS — Z3480 Encounter for supervision of other normal pregnancy, unspecified trimester: Secondary | ICD-10-CM

## 2018-08-01 NOTE — Patient Instructions (Signed)
 Hello,  Given the current COVID-19 pandemic, our practice is making changes in how we are providing care to our patients. We are limiting in-person visits for the safety of all of our patients.   As a practice, we have met to discuss the best way to minimize visits, but still provide excellent care to our expecting mothers.  We have decided on the following visit structure for low-risk pregnancies.  Initial Pregnancy visit will be conducted as a telephone or web visit.  Between 10-14 weeks  there will be one in-person visit for an ultrasound, lab work, and genetic screening. 20 weeks in-person visit with an anatomy ultrasound  28 weeks in-person office visit for a 1-hour glucose test and a TDAP vaccination 32 weeks in-person office visit 34 weeks telephone visit 36 weeks in-person office visit for GBS, chlamydia, and gonorrhea testing 38 weeks in-person office visit 40 weeks in-person office visit  Understandably, some patients will require more visits than what is outlined above. Additional visits will be determined on a case-by-case basis.   We will, as always, be available for emergencies or to address concerns that might arise between in-person visits. We ask that you allow us the opportunity to address any concerns over the phone or through a virtual visit first. We will be available to return your phone calls throughout the day.   If you are able to purchase a scale, a blood pressure machine, and a home fetal doppler visits could be limited further. This will help decrease your exposure risks, but these purchases are not a necessity.   Things seem to change daily and there is the possibility that this structure could change, please be patient as we adapt to a new way of caring for patients.   Thank you for trusting us with your prenatal care. Our practice values you and looks forward to providing you with excellent care.   Sincerely,   [facility], [facility]      COVID-19 and Your Pregnancy FAQ  How can I prevent infection with COVID-19 during my pregnancy? Social distancing is key. Please limit any interactions in public. Try and work from home if possible. Frequently wash your hands after touching possibly contaminated surfaces. Avoid touching your face.  Minimize trips to the store. Consider online ordering when possible.   Should I wear a mask? YES. It is recommended by the CDC that all people wear a cloth mask or facial covering in public. This will help reduce transmission as well as your risk or acquiring COVID-19. New studies are showing that even asymptomatic individuals can spread the virus from talking.   What are the symptoms of COVID-19? Fever (greater than 100.4 F), dry cough, shortness of breath.  Am I more at risk for COVID-19 since I am pregnant? There is not currently data showing that pregnant women are more adversely impacted by COVID-19 than the general population. However, we know that pregnant women tend to have worse respiratory complications from similar diseases such as the flu and SARS and for this reason should be considered an at-risk population.  What do I do if I am experiencing the symptoms of COVID-19? Testing is being limited because of test availability. If you are experiencing symptoms you should quarantine yourself, and the members of your family, for at least 2 weeks at home.   Please visit this website for more information: https://www.cdc.gov/coronavirus/2019-ncov/if-you-are-sick/steps-when-sick.html  When should I go to the Emergency Room? Please go to the emergency room if   you are experiencing ANY of these symptoms*:  1.    Difficulty breathing or shortness of breath 2.    Persistent pain or pressure in the chest 3.    Confusion or difficulty being aroused (or awakened) 4.    Bluish lips or face  *This list is not all inclusive. Please consult our office for any other symptoms that are severe  or concerning.  What do I do if I am having difficulty breathing? You should go to the Emergency Room for evaluation. At this time they have a tent set up for evaluating patients with COVID-19 symptoms.   How will my prenatal care be different because of the COVID-19 pandemic? It has been recommended to reduce the frequency of face-to-face visits and use resources such as telephone and virtual visits when possible. Using a scale, blood pressure machine and fetal doppler at home can further help reduce face-to-face visits. You will be provided with additional information on this topic.  We ask that you come to your visits alone to minimize potential exposures to  COVID-19.  How can I receive childbirth education? At this time in-person classes have been cancelled. You can register for online childbirth education, breastfeeding, and newborn care classes.  Please visit:  [email or web address] for more information  How will my hospital birth experience be different? The hospital is currently limiting visitors. This means that while you are in labor you can only have one person at the hospital with you. Additional family members will not be allowed to wait in the building or outside your room. Your one support person can be the father of the baby, a relative, a doula, or a friend. Once one support person is designated that person will wear a band. This band cannot be shared with multiple people.  Nitrous Gas is not being offered for pain relief since the tubing and filter for the machine can not be sanitized in a way to guarantee prevention of transmission of COVID-19.  Nasal cannula use of oxygen for fetal indications has also been discontinued.  Currently a clear plastic sheet is being hung between mom and the delivering provider during pushing and delivery to help prevent transmission of COVID-19.      How long will I stay in the hospital for after giving birth? It is also recommended that  discharge home be expedited during the COVID-19 outbreak. This means staying for 1 day after a vaginal delivery and 2 days after a cesarean section. Patients who need to stay longer for medical reasons are allowed to do so, but the goal will be for expedited discharge home.   What if I have COVID-19 and I am in labor? We ask that you wear a mask while on labor and delivery. We will try and accommodate you being placed in a room that is capable of filtering the air. Please call ahead if you are in labor and on your way to the hospital. The phone number for labor and delivery at [facility] is [phone].  If I have COVID-19 when my baby is born how can I prevent my baby from contracting COVID-19? This is an issue that will have to be discussed on a case-by-case basis. Current recommendations suggest providing separate isolation rooms for both the mother and new infant as well as limiting visitors. However, there are practical challenges to this recommendation. The situation will assuredly change and decisions will be influenced by the desires of the mother and availability of space.    Some suggestions are the use of a curtain or physical barrier between mom and infant, hand hygiene, mom wearing a mask, or 6 feet of spacing between a mom and infant.   Can I breastfeed during the COVID-19 pandemic?   Yes, breastfeeding is encouraged.  Can I breastfeed if I have COVID-19? Yes. Covid-19 has not been found in breast milk. This means you cannot give COVID-19 to your child through breast milk. Breast feeding will also help pass antibodies to fight infection to your baby.   What precautions should I take when breastfeeding if I have COVID-19? If a mother and newborn do room-in and the mother wishes to feed at the breast, she should put on a facemask and practice hand hygiene before each feeding.  What precautions should I take when pumping if I have COVID-19? Prior to expressing  breast milk, mothers should practice hand hygiene. After each pumping session, all parts that come into contact with breast milk should be thoroughly washed and the entire pump should be appropriately disinfected per the manufacturer's instructions. This expressed breast milk should be fed to the newborn by a healthy caregiver.  What if I am pregnant and work in healthcare? Based on limited data regarding COVID-19 and pregnancy, ACOG currently does not propose creating additional restrictions on pregnant health care personnel because of COVID-19 alone. Pregnant women do not appear to be at higher risk of severe disease related to COVID-19. Pregnant health care personnel should follow CDC risk assessment and infection control guidelines for health care personnel exposed to patients with suspected or confirmed COVID-19. Adherence to recommended infection prevention and control practices is an important part of protecting all health care personnel in health care settings.    Information on COVID-19 in pregnancy is very limited; however, facilities may want to consider limiting exposure of pregnant health care personnel to patients with confirmed or suspected COVID-19 infection, especially during higher-risk procedures (eg, aerosol-generating procedures), if feasible, based on staffing availability.

## 2018-08-01 NOTE — Progress Notes (Signed)
  Subjective  Fetal Movement? yes Contractions? no Leaking Fluid? no Vaginal Bleeding? no  Objective  BP 120/70   Wt 185 lb (83.9 kg)   LMP 01/01/2018 (Approximate)   BMI 31.76 kg/m  General: NAD Pumonary: no increased work of breathing Abdomen: gravid, non-tender Extremities: no edema Psychiatric: mood appropriate, affect full  Assessment  18 y.o. [id number] at [redacted]w[redacted]d by  10/08/2018, by Last Menstrual Period presenting for routine prenatal visit  Plan   Problem List Items Addressed This Visit      Other   Late prenatal care    Other Visit Diagnoses    [redacted] weeks gestation of pregnancy    -  Primary   Supervision of other normal pregnancy, antepartum        Prior CS, desires repeat.  Discussed VBAC: OB/GYN  Counseling Note  18 y.o. [id number] at [redacted]w[redacted]d with Estimated Date of Delivery: 10/08/18 was seen today in office to discuss trial of labor after cesarean section (TOLAC) versus elective repeat cesarean delivery (ERCD). The following risks were discussed with the patient.  Risk of uterine rupture at term is 0.78 percent with TOLAC and 0.22 percent with ERCD. 1 in 10 uterine ruptures will result in neonatal death or neurological injury. The benefits of a trial of labor after cesarean (TOLAC) resulting in a vaginal birth after cesarean (VBAC) include the following: shorter length of hospital stay and postpartum recovery (in most cases); fewer complications, such as postpartum fever, wound or uterine infection, thromboembolism (blood clots in the leg or lung), need for blood transfusion and fewer neonatal breathing problems.  The risks of an attempted VBAC or TOLAC include the following: Risk of failed trial of labor after cesarean (TOLAC) without a vaginal birth after cesarean (VBAC) resulting in repeat cesarean delivery (RCD) in about 20 to 40 percent of women who attempt VBAC.  Risk of rupture of uterus resulting in an emergency cesarean delivery. The risk of uterine rupture may be  related in part to the type of uterine incision made during the first cesarean delivery. A previous transverse uterine incision has the lowest risk of rupture (0.2 to 1.5 percent risk). Vertical or T-shaped uterine incisions have a higher risk of uterine rupture (4 to 9 percent risk)The risk of fetal death is very low with both VBAC and elective repeat cesarean delivery (ERCD), but the likelihood of fetal death is higher with VBAC than with ERCD. Maternal death is very rare with either type of delivery.  The risks of an elective repeat cesarean delivery (ERCD) were reviewed with the patient including but not limited to: 05/998 risk of uterine rupture which could have serious consequences, bleeding which may require transfusion; infection which may require antibiotics; injury to bowel, bladder or other surrounding organs (bowel, bladder, ureters); injury to the fetus; need for additional procedures including hysterectomy in the event of a life-threatening hemorrhage; thromboembolic phenomenon; abnormal placentation; incisional problems; death and other postoperative or anesthesia complications.    Labs and glucola today Discussed genetics and late for benefit of testing; declined TDaP today  Sch CS nv  Annamarie Major, MD, [facility], [facility] 08/01/2018  3:37 PM

## 2018-08-01 NOTE — Addendum Note (Signed)
Addended by: Cornelius Moras D on: 08/01/2018 03:48 PM   Modules accepted: Orders

## 2018-08-02 LAB — 28 WEEK RH+PANEL
Basophils Absolute: 0.1 10*3/uL (ref 0.0–0.2)
Basos: 0 %
EOS (ABSOLUTE): 0.1 10*3/uL (ref 0.0–0.4)
Eos: 1 %
Gestational Diabetes Screen: 93 mg/dL (ref 65–139)
HIV Screen 4th Generation wRfx: NONREACTIVE
Hematocrit: 33.3 % — ABNORMAL LOW (ref 34.0–46.6)
Hemoglobin: 11.2 g/dL (ref 11.1–15.9)
Immature Grans (Abs): 0.6 10*3/uL — ABNORMAL HIGH (ref 0.0–0.1)
Immature Granulocytes: 3 %
Lymphocytes Absolute: 3.1 10*3/uL (ref 0.7–3.1)
Lymphs: 15 %
MCH: 28.8 pg (ref 26.6–33.0)
MCHC: 33.6 g/dL (ref 31.5–35.7)
MCV: 86 fL (ref 79–97)
Monocytes Absolute: 1.1 10*3/uL — ABNORMAL HIGH (ref 0.1–0.9)
Monocytes: 5 %
Neutrophils Absolute: 15.7 10*3/uL — ABNORMAL HIGH (ref 1.4–7.0)
Neutrophils: 76 %
Platelets: 301 10*3/uL (ref 150–450)
RBC: 3.89 x10E6/uL (ref 3.77–5.28)
RDW: 12.1 % (ref 11.7–15.4)
RPR Ser Ql: NONREACTIVE
WBC: 20.8 10*3/uL (ref 3.4–10.8)

## 2018-08-03 ENCOUNTER — Other Ambulatory Visit: Payer: Self-pay | Admitting: Advanced Practice Midwife

## 2018-08-03 DIAGNOSIS — O2343 Unspecified infection of urinary tract in pregnancy, third trimester: Secondary | ICD-10-CM

## 2018-08-03 MED ORDER — CEPHALEXIN 500 MG PO CAPS: 500.0000 mg | ORAL_CAPSULE | Freq: Four times a day (QID) | ORAL | 0 refills | Status: AC

## 2018-08-03 NOTE — Progress Notes (Signed)
Patient has had pain in the right side of her back recently, similar to the pain she had in December when she was treated for Pyelo. She is unable to stop by the office today to give a urine specimen. Rx Keflex sent to her pharmacy. Advised her to come in next week when she is able if symptoms are not resolving.

## 2018-08-15 ENCOUNTER — Ambulatory Visit (INDEPENDENT_AMBULATORY_CARE_PROVIDER_SITE_OTHER): Payer: Medicaid Other | Admitting: Maternal Newborn

## 2018-08-15 ENCOUNTER — Other Ambulatory Visit: Payer: Self-pay

## 2018-08-15 DIAGNOSIS — O0933 Supervision of pregnancy with insufficient antenatal care, third trimester: Secondary | ICD-10-CM | POA: Diagnosis not present

## 2018-08-15 DIAGNOSIS — O0990 Supervision of high risk pregnancy, unspecified, unspecified trimester: Secondary | ICD-10-CM

## 2018-08-15 DIAGNOSIS — Z3A32 32 weeks gestation of pregnancy: Secondary | ICD-10-CM

## 2018-08-15 NOTE — Progress Notes (Addendum)
Virtual Visit via Telephone Note  I connected with Michelle Nelson on 08/15/2018 at 10:26 AM EDT by telephone and verified that I am speaking with the correct person using two identifiers.  Location: Patient: Home Provider: Office/[facility]   I discussed the limitations, risks, security and privacy concerns of performing an evaluation and management service by telephone and the availability of in person appointments. The patient expressed understanding and agreed to proceed.  See note below for documentation of this ROB telephone visit.   I discussed the assessment and treatment plan with the patient. The patient was provided an opportunity to ask questions and all were answered. The patient agreed with the plan and demonstrated an understanding of the instructions.   The patient was advised to call back or seek an in-person evaluation if the symptoms worsen or if the condition fails to improve as anticipated.  I provided 7 minutes of non-face-to-face time during this encounter.    Oswaldo Conroy, CNM      Routine Prenatal Care Telephone Visit  Subjective  Michelle Nelson is a 18 y.o. [phone] at [redacted]w[redacted]d being contacted today for ongoing prenatal care.  She is currently monitored for the following issues for this high-risk pregnancy and has Pyelonephritis; Supervision of high risk pregnancy, antepartum; and Late prenatal care on their problem list.  ----------------------------------------------------------------------------------- Patient reports heartburn. Having around 3-4 Braxton-Hicks contractions daily. Baby is active and moving well.   Contractions: Irritability. Vag. Bleeding: None.  Movement: Present. No leaking of fluid.  ----------------------------------------------------------------------------------- The following portions of the patient's history were reviewed and updated as appropriate: allergies, current medications, past family history, past medical history, past social  history, past surgical history and problem list. Problem list updated.   Objective  Last menstrual period 01/01/2018. Pregravid weight 165 lb (74.8 kg) Total Weight Gain 20 lb (9.072 kg)  Assessment   18 y.o. [id number] at [redacted]w[redacted]d EDD 10/08/2018, by Last Menstrual Period presenting for a routine prenatal visit.  Plan  Advised taking Pepcid OTC to treat heartburn and prevent acid reflux.  She asked about scheduling for her planned repeat Cesarean procedure. Will send scheduling request.   Preterm labor symptoms and general obstetric precautions including but not limited to vaginal bleeding, contractions, leaking of fluid and fetal movement were reviewed.  Please refer to After Visit Summary for other counseling recommendations.   Return in about 2 weeks (around 08/29/2018) for ROB.  Marcelyn Bruins, CNM 08/15/2018

## 2018-08-15 NOTE — Progress Notes (Signed)
Verified identity w/DOB & Address+ braxton hicks ctx. No complaints

## 2018-08-16 ENCOUNTER — Telehealth: Payer: Self-pay | Admitting: Obstetrics and Gynecology

## 2018-08-16 ENCOUNTER — Encounter: Payer: Self-pay | Admitting: Maternal Newborn

## 2018-08-16 NOTE — Patient Instructions (Signed)
Third Trimester of Pregnancy The third trimester is from week 28 through week 40 (months 7 through 9). The third trimester is a time when the unborn baby (fetus) is growing rapidly. At the end of the ninth month, the fetus is about 20 inches in length and weighs 6-10 pounds. Body changes during your third trimester Your body will continue to go through many changes during pregnancy. The changes vary from woman to woman. During the third trimester:  Your weight will continue to increase. You can expect to gain 25-35 pounds (11-16 kg) by the end of the pregnancy.  You may begin to get stretch marks on your hips, abdomen, and breasts.  You may urinate more often because the fetus is moving lower into your pelvis and pressing on your bladder.  You may develop or continue to have heartburn. This is caused by increased hormones that slow down muscles in the digestive tract.  You may develop or continue to have constipation because increased hormones slow digestion and cause the muscles that push waste through your intestines to relax.  You may develop hemorrhoids. These are swollen veins (varicose veins) in the rectum that can itch or be painful.  You may develop swollen, bulging veins (varicose veins) in your legs.  You may have increased body aches in the pelvis, back, or thighs. This is due to weight gain and increased hormones that are relaxing your joints.  You may have changes in your hair. These can include thickening of your hair, rapid growth, and changes in texture. Some women also have hair loss during or after pregnancy, or hair that feels dry or thin. Your hair will most likely return to normal after your baby is born.  Your breasts will continue to grow and they will continue to become tender. A yellow fluid (colostrum) may leak from your breasts. This is the first milk you are producing for your baby.  Your belly button may stick out.  You may notice more swelling in your hands,  face, or ankles.  You may have increased tingling or numbness in your hands, arms, and legs. The skin on your belly may also feel numb.  You may feel short of breath because of your expanding uterus.  You may have more problems sleeping. This can be caused by the size of your belly, increased need to urinate, and an increase in your body's metabolism.  You may notice the fetus "dropping," or moving lower in your abdomen (lightening).  You may have increased vaginal discharge.  You may notice your joints feel loose and you may have pain around your pelvic bone. What to expect at prenatal visits You will have prenatal exams every 2 weeks until week 36. Then you will have weekly prenatal exams. During a routine prenatal visit:  You will be weighed to make sure you and the baby are growing normally.  Your blood pressure will be taken.  Your abdomen will be measured to track your baby's growth.  The fetal heartbeat will be listened to.  Any test results from the previous visit will be discussed.  You may have a cervical check near your due date to see if your cervix has softened or thinned (effaced).  You will be tested for Group B streptococcus. This happens between 35 and 37 weeks. Your health care provider may ask you:  What your birth plan is.  How you are feeling.  If you are feeling the baby move.  If you have had any abnormal   symptoms, such as leaking fluid, bleeding, severe headaches, or abdominal cramping.  If you are using any tobacco products, including cigarettes, chewing tobacco, and electronic cigarettes.  If you have any questions. Other tests or screenings that may be performed during your third trimester include:  Blood tests that check for low iron levels (anemia).  Fetal testing to check the health, activity level, and growth of the fetus. Testing is done if you have certain medical conditions or if there are problems during the pregnancy.  Nonstress test  (NST). This test checks the health of your baby to make sure there are no signs of problems, such as the baby not getting enough oxygen. During this test, a belt is placed around your belly. The baby is made to move, and its heart rate is monitored during movement. What is false labor? False labor is a condition in which you feel small, irregular tightenings of the muscles in the womb (contractions) that usually go away with rest, changing position, or drinking water. These are called Braxton Hicks contractions. Contractions may last for hours, days, or even weeks before true labor sets in. If contractions come at regular intervals, become more frequent, increase in intensity, or become painful, you should see your health care provider. What are the signs of labor?  Abdominal cramps.  Regular contractions that start at 10 minutes apart and become stronger and more frequent with time.  Contractions that start on the top of the uterus and spread down to the lower abdomen and back.  Increased pelvic pressure and dull back pain.  A watery or bloody mucus discharge that comes from the vagina.  Leaking of amniotic fluid. This is also known as your "water breaking." It could be a slow trickle or a gush. Let your health care provider know if it has a color or strange odor. If you have any of these signs, call your health care provider right away, even if it is before your due date. Follow these instructions at home: Medicines  Follow your health care provider's instructions regarding medicine use. Specific medicines may be either safe or unsafe to take during pregnancy.  Take a prenatal vitamin that contains at least 600 micrograms (mcg) of folic acid.  If you develop constipation, try taking a stool softener if your health care provider approves. Eating and drinking   Eat a balanced diet that includes fresh fruits and vegetables, whole grains, good sources of protein such as meat, eggs, or tofu,  and low-fat dairy. Your health care provider will help you determine the amount of weight gain that is right for you.  Avoid raw meat and uncooked cheese. These carry germs that can cause birth defects in the baby.  If you have low calcium intake from food, talk to your health care provider about whether you should take a daily calcium supplement.  Eat four or five small meals rather than three large meals a day.  Limit foods that are high in fat and processed sugars, such as fried and sweet foods.  To prevent constipation: ? Drink enough fluid to keep your urine clear or pale yellow. ? Eat foods that are high in fiber, such as fresh fruits and vegetables, whole grains, and beans. Activity  Exercise only as directed by your health care provider. Most women can continue their usual exercise routine during pregnancy. Try to exercise for 30 minutes at least 5 days a week. Stop exercising if you experience uterine contractions.  Avoid heavy lifting.  Do   not exercise in extreme heat or humidity, or at high altitudes.  Wear low-heel, comfortable shoes.  Practice good posture.  You may continue to have sex unless your health care provider tells you otherwise. Relieving pain and discomfort  Take frequent breaks and rest with your legs elevated if you have leg cramps or low back pain.  Take warm sitz baths to soothe any pain or discomfort caused by hemorrhoids. Use hemorrhoid cream if your health care provider approves.  Wear a good support bra to prevent discomfort from breast tenderness.  If you develop varicose veins: ? Wear support pantyhose or compression stockings as told by your healthcare provider. ? Elevate your feet for 15 minutes, 3-4 times a day. Prenatal care  Write down your questions. Take them to your prenatal visits.  Keep all your prenatal visits as told by your health care provider. This is important. Safety  Wear your seat belt at all times when driving.  Make  a list of emergency phone numbers, including numbers for family, friends, the hospital, and police and fire departments. General instructions  Avoid cat litter boxes and soil used by cats. These carry germs that can cause birth defects in the baby. If you have a cat, ask someone to clean the litter box for you.  Do not travel far distances unless it is absolutely necessary and only with the approval of your health care provider.  Do not use hot tubs, steam rooms, or saunas.  Do not drink alcohol.  Do not use any products that contain nicotine or tobacco, such as cigarettes and e-cigarettes. If you need help quitting, ask your health care provider.  Do not use any medicinal herbs or unprescribed drugs. These chemicals affect the formation and growth of the baby.  Do not douche or use tampons or scented sanitary pads.  Do not cross your legs for long periods of time.  To prepare for the arrival of your baby: ? Take prenatal classes to understand, practice, and ask questions about labor and delivery. ? Make a trial run to the hospital. ? Visit the hospital and tour the maternity area. ? Arrange for maternity or paternity leave through employers. ? Arrange for family and friends to take care of pets while you are in the hospital. ? Purchase a rear-facing car seat and make sure you know how to install it in your car. ? Pack your hospital bag. ? Prepare the baby's nursery. Make sure to remove all pillows and stuffed animals from the baby's crib to prevent suffocation.  Visit your dentist if you have not gone during your pregnancy. Use a soft toothbrush to brush your teeth and be gentle when you floss. Contact a health care provider if:  You are unsure if you are in labor or if your water has broken.  You become dizzy.  You have mild pelvic cramps, pelvic pressure, or nagging pain in your abdominal area.  You have lower back pain.  You have persistent nausea, vomiting, or  diarrhea.  You have an unusual or bad smelling vaginal discharge.  You have pain when you urinate. Get help right away if:  Your water breaks before 37 weeks.  You have regular contractions less than 5 minutes apart before 37 weeks.  You have a fever.  You are leaking fluid from your vagina.  You have spotting or bleeding from your vagina.  You have severe abdominal pain or cramping.  You have rapid weight loss or weight gain.  You have   shortness of breath with chest pain.  You notice sudden or extreme swelling of your face, hands, ankles, feet, or legs.  Your baby makes fewer than 10 movements in 2 hours.  You have severe headaches that do not go away when you take medicine.  You have vision changes. Summary  The third trimester is from week 28 through week 40, months 7 through 9. The third trimester is a time when the unborn baby (fetus) is growing rapidly.  During the third trimester, your discomfort may increase as you and your baby continue to gain weight. You may have abdominal, leg, and back pain, sleeping problems, and an increased need to urinate.  During the third trimester your breasts will keep growing and they will continue to become tender. A yellow fluid (colostrum) may leak from your breasts. This is the first milk you are producing for your baby.  False labor is a condition in which you feel small, irregular tightenings of the muscles in the womb (contractions) that eventually go away. These are called Braxton Hicks contractions. Contractions may last for hours, days, or even weeks before true labor sets in.  Signs of labor can include: abdominal cramps; regular contractions that start at 10 minutes apart and become stronger and more frequent with time; watery or bloody mucus discharge that comes from the vagina; increased pelvic pressure and dull back pain; and leaking of amniotic fluid. This information is not intended to replace advice given to you by your  health care provider. Make sure you discuss any questions you have with your health care provider. Document Released: 03/22/2001 Document Revised: 05/03/2016 Document Reviewed: 05/03/2016 Elsevier Interactive Patient Education  2019 [employer]  

## 2018-08-16 NOTE — Telephone Encounter (Signed)
-----   Message from Oswaldo Conroy, CNM sent at 08/15/2018 11:24 AM EDT ----- Regarding: Schedule Cesarean Surgery Booking Request Patient Full Name: Michelle Nelson MRN: [id number]  DOB: [birth year]  Surgeon: Thomasene Mohair, MD  Requested Surgery Date and Time: 10/02/2018 Primary Diagnosis AND Code: Repeat Cesarean Secondary Diagnosis and Code:  Surgical Procedure: Cesarean Section L&D Notification: Yes Admission Status: surgery admit Length of Surgery: 1 hour Special Case Needs: On-Q pump H&P: Needs (date) Phone Interview???: No Interpreter: No Language:  Medical Clearance: N/A Special Scheduling Instructions: N/A

## 2018-08-16 NOTE — Telephone Encounter (Signed)
Patient is aware of H&P at [facility] on 09/25/18 @c  9:10am w/ Dr. Jean Rosenthal, Pre-admit Testing to be scheduled, and OR on 10/02/18.

## 2018-08-19 ENCOUNTER — Observation Stay
Admission: EM | Admit: 2018-08-19 | Discharge: 2018-08-19 | Disposition: A | Discharge status: Home / Self Care | Payer: Medicaid Other | Attending: Obstetrics and Gynecology | Admitting: Obstetrics and Gynecology

## 2018-08-19 ENCOUNTER — Other Ambulatory Visit: Payer: Self-pay

## 2018-08-19 DIAGNOSIS — N898 Other specified noninflammatory disorders of vagina: Secondary | ICD-10-CM | POA: Diagnosis not present

## 2018-08-19 DIAGNOSIS — R3915 Urgency of urination: Secondary | ICD-10-CM | POA: Insufficient documentation

## 2018-08-19 DIAGNOSIS — O418X30 Other specified disorders of amniotic fluid and membranes, third trimester, not applicable or unspecified: Principal | ICD-10-CM | POA: Insufficient documentation

## 2018-08-19 DIAGNOSIS — Z3A32 32 weeks gestation of pregnancy: Secondary | ICD-10-CM

## 2018-08-19 DIAGNOSIS — O26893 Other specified pregnancy related conditions, third trimester: Secondary | ICD-10-CM | POA: Diagnosis not present

## 2018-08-19 HISTORY — DX: Other specified health status: Z78.9

## 2018-08-19 LAB — CHLAMYDIA/NGC RT PCR (ARMC ONLY)
Chlamydia Tr: NOT DETECTED
N gonorrhoeae: NOT DETECTED

## 2018-08-19 LAB — URINALYSIS, COMPLETE (UACMP) WITH MICROSCOPIC
Bilirubin Urine: NEGATIVE
Glucose, UA: NEGATIVE mg/dL
Hgb urine dipstick: NEGATIVE
Ketones, ur: NEGATIVE mg/dL
Nitrite: NEGATIVE
Protein, ur: NEGATIVE mg/dL
Specific Gravity, Urine: 1.01 (ref 1.005–1.030)
pH: 7 (ref 5.0–8.0)

## 2018-08-19 LAB — RUPTURE OF MEMBRANE (ROM)PLUS: Rom Plus: NEGATIVE

## 2018-08-19 NOTE — OB Triage Note (Signed)
Discharge instructions reviewed with patient. Pt had no questions regarding instructions and instructed when to return or call provider for further issues. Pt discharged home in stable ambulatory condition.

## 2018-08-19 NOTE — OB Triage Note (Signed)
Pt is a 18yo G3P1 At [redacted]w[redacted]d that presents via EMS with c/o LOF, and pelvic pressure. Pt states about an hour ago she noticed her panties were wet through to her pants and it was clear fluid. Pt currently taking ABX for elevated WBC and possible UTI/kidney infection from past week. Pt reports history of UTI and kidney infection throught pregnancy. Pt states has been having period cramps all day today but denies VB. Pt states positive FM. Monitors applied and Initial FHT 135.

## 2018-08-19 NOTE — Discharge Summary (Signed)
Physician Final Progress Note  Patient ID: Michelle Nelson MRN: [id number] DOB/AGE: [birth year] 18 y.o.  Admit date: 08/19/2018 Admitting provider: Vena Austria, MD Discharge date: 08/19/2018   Admission Diagnoses: Leaking fluid, pressure, urinary urgency  Discharge Diagnoses:  Active Problems:   Labor and delivery indication for care or intervention  18 year old G3P0111 at 103w6d presenting via EMS for complaints of pelvic pressure, leaking clear fluid, and urinary urgency.  Recently treated for possible UTI given symptoms.  No dysuria, no flank or thoracic back pain.  +FM, no VB, no ctx.  Evaluation revealed normal UA, rare bacteria, given symptoms UCx was sent but anticipate negative given recent Keflex course.  ROM plus negative, ferning and pooling also negative with closed cervix.  Blood pressure (!) 114/59, pulse 88, temperature 98.1 F (36.7 C), temperature source Oral, resp. rate 15, height 5\' 4"  (1.626 m), weight 83.9 kg, last menstrual period 01/01/2018.    Consults: None  Significant Findings/ Diagnostic Studies:  Results for orders placed or performed during the hospital encounter of 08/19/18 (from the past 24 hour(s))  ROM Plus ([facility] only)     Status: None   Collection Time: 08/19/18  9:06 PM  Result Value Ref Range   Rom Plus NEGATIVE   Urinalysis, Complete w Microscopic     Status: Abnormal   Collection Time: 08/19/18  9:06 PM  Result Value Ref Range   Color, Urine YELLOW (A) YELLOW   APPearance HAZY (A) CLEAR   Specific Gravity, Urine 1.010 1.005 - 1.030   pH 7.0 5.0 - 8.0   Glucose, UA NEGATIVE NEGATIVE mg/dL   Hgb urine dipstick NEGATIVE NEGATIVE   Bilirubin Urine NEGATIVE NEGATIVE   Ketones, ur NEGATIVE NEGATIVE mg/dL   Protein, ur NEGATIVE NEGATIVE mg/dL   Nitrite NEGATIVE NEGATIVE   Leukocytes,Ua SMALL (A) NEGATIVE   RBC / HPF 0-5 0 - 5 RBC/hpf   WBC, UA 0-5 0 - 5 WBC/hpf   Bacteria, UA RARE (A) NONE SEEN   Squamous Epithelial / LPF 0-5 0 - 5   Mucus  PRESENT      Procedures:  Baseline: 145 Variability: moderate Accelerations: present Decelerations: absent Tocometry: none The patient was monitored for 30 minutes, fetal heart rate tracing was deemed reactive, category II tracing,  Discharge Condition: good  Disposition: Discharge disposition: 01-Home or Self Care       Diet: Regular diet  Discharge Activity: Activity as tolerated  Discharge Instructions    Discharge activity:  No Restrictions   Complete by:  As directed    Discharge diet:  No restrictions   Complete by:  As directed    No sexual activity restrictions   Complete by:  As directed    Notify physician for a general feeling that "something is not right"   Complete by:  As directed    Notify physician for increase or change in vaginal discharge   Complete by:  As directed    Notify physician for intestinal cramps, with or without diarrhea, sometimes described as "gas pain"   Complete by:  As directed    Notify physician for leaking of fluid   Complete by:  As directed    Notify physician for low, dull backache, unrelieved by heat or Tylenol   Complete by:  As directed    Notify physician for menstrual like cramps   Complete by:  As directed    Notify physician for pelvic pressure   Complete by:  As directed    Notify physician  for uterine contractions.  These may be painless and feel like the uterus is tightening or the baby is  "balling up"   Complete by:  As directed    Notify physician for vaginal bleeding   Complete by:  As directed    PRETERM LABOR:  Includes any of the follwing symptoms that occur between 20 - [redacted] weeks gestation.  If these symptoms are not stopped, preterm labor can result in preterm delivery, placing your baby at risk   Complete by:  As directed      Allergies as of 08/19/2018   No Known Allergies     Medication List    STOP taking these medications   cephALEXin 500 MG capsule Commonly known as:  KEFLEX     TAKE  these medications   prenatal multivitamin Tabs tablet Take 1 tablet by mouth daily at 12 noon.        Total time spent taking care of this patient: 30 minutes  Signed: Vena Austriandreas [name] 08/19/2018, 10:16 PM

## 2018-08-21 LAB — URINE CULTURE
Culture: <10000 — AB
Special Requests: NORMAL

## 2018-08-29 ENCOUNTER — Encounter: Payer: Medicaid Other | Admitting: Obstetrics and Gynecology

## 2018-08-31 ENCOUNTER — Ambulatory Visit (INDEPENDENT_AMBULATORY_CARE_PROVIDER_SITE_OTHER): Payer: Medicaid Other | Admitting: Maternal Newborn

## 2018-08-31 ENCOUNTER — Other Ambulatory Visit: Payer: Self-pay

## 2018-08-31 ENCOUNTER — Encounter: Payer: Self-pay | Admitting: Maternal Newborn

## 2018-08-31 VITALS — BP 120/60 | Wt 186.0 lb

## 2018-08-31 DIAGNOSIS — O0990 Supervision of high risk pregnancy, unspecified, unspecified trimester: Secondary | ICD-10-CM

## 2018-08-31 DIAGNOSIS — O0933 Supervision of pregnancy with insufficient antenatal care, third trimester: Secondary | ICD-10-CM

## 2018-08-31 DIAGNOSIS — Z3A34 34 weeks gestation of pregnancy: Secondary | ICD-10-CM

## 2018-08-31 NOTE — Progress Notes (Signed)
ROB- lower abdomen cramping for about two weeks, headaches for the past 5 days

## 2018-08-31 NOTE — Progress Notes (Signed)
    Routine Prenatal Care Visit  Subjective  Michelle Nelson is a 18 y.o. [phone] at [redacted]w[redacted]d being seen today for ongoing prenatal care.  She is currently monitored for the following issues for this high-risk pregnancy and has Pyelonephritis; Supervision of high risk pregnancy, antepartum; and Late prenatal care on their problem list.  ----------------------------------------------------------------------------------- Patient reports Braxton-Hicks contractions occasional headaches.   Contractions: Irritability. Vag. Bleeding: None.  Movement: Present. No leaking of fluid.  ----------------------------------------------------------------------------------- The following portions of the patient's history were reviewed and updated as appropriate: allergies, current medications, past family history, past medical history, past social history, past surgical history and problem list. Problem list updated.   Objective  Blood pressure 120/60, weight 186 lb (84.4 kg), last menstrual period 01/01/2018. Pregravid weight 165 lb (74.8 kg) Total Weight Gain 21 lb (9.526 kg)  Fetal Status: Fetal Heart Rate (bpm): 145 Fundal Height: 34 cm Movement: Present     General:  Alert, oriented and cooperative. Patient is in no acute distress.  Skin: Skin is warm and dry. No rash noted.   Cardiovascular: Normal heart rate noted  Respiratory: Normal respiratory effort, no problems with respiration noted  Abdomen: Soft, gravid, appropriate for gestational age. Pain/Pressure: Present     Pelvic:  Cervical exam deferred        Extremities: Normal range of motion.  Edema: None  Mental Status: Normal mood and affect. Normal behavior. Normal judgment and thought content.     Assessment   18 y.o. [id number] at [redacted]w[redacted]d, EDD 10/08/2018 by Last Menstrual Period presenting for a routine prenatal visit.  Plan   pregnancy #3 Problems (from 04/06/18 to present)    Problem Noted Resolved   Supervision of high risk pregnancy,  antepartum 04/06/2018 by Farrel Conners, CNM No   Overview Addendum 08/31/2018  2:00 PM by Oswaldo Conroy, CNM    Clinic [facility] Prenatal Labs  Dating  Blood type:     Genetic Screen 1 Screen:    AFP:     Quad:     NIPS: Antibody:   Anatomic [place]  Rubella:   Varicella: @VZVIGG @  GTT Early:               Third trimester:  RPR: Non Reactive (04/22 1536)   Rhogam  HBsAg:     TDaP vaccine                       Flu Shot: HIV: Non Reactive (04/22 1536)   Baby Food                                GBS:   Contraception  Pap:  CBB     CS/VBAC 35 week in 2017   Support Person Dee Hx baby with gastroschesis          Discussed OTC ES Tylenol and caffeine for headache relief. Requesting assistance to obtain a car seat. Will contact care management.  Preterm labor symptoms and general obstetric precautions including but not limited to vaginal bleeding, contractions, leaking of fluid and fetal movement were reviewed.  Please refer to After Visit Summary for other counseling recommendations.   Return in about 2 weeks (around 09/14/2018) for ROB.  Marcelyn Bruins, CNM 08/31/2018  2:03 PM

## 2018-09-01 LAB — VARICELLA ZOSTER ANTIBODY, IGG: Varicella zoster IgG: 225 index (ref >165)

## 2018-09-01 LAB — ANTIBODY SCREEN: Antibody Screen: NEGATIVE

## 2018-09-01 LAB — ABO AND RH: Rh Factor: POSITIVE

## 2018-09-01 LAB — RUBELLA SCREEN: Rubella Antibodies, IGG: 1.49 index (ref >0.99)

## 2018-09-08 ENCOUNTER — Encounter: Payer: Self-pay | Admitting: *Deleted

## 2018-09-08 ENCOUNTER — Observation Stay
Admission: EM | Admit: 2018-09-08 | Discharge: 2018-09-08 | Disposition: A | Discharge status: Home / Self Care | Payer: Medicaid Other | Attending: Obstetrics and Gynecology | Admitting: Obstetrics and Gynecology

## 2018-09-08 DIAGNOSIS — O26893 Other specified pregnancy related conditions, third trimester: Secondary | ICD-10-CM | POA: Diagnosis present

## 2018-09-08 DIAGNOSIS — M545 Low back pain: Secondary | ICD-10-CM | POA: Diagnosis present

## 2018-09-08 DIAGNOSIS — O0990 Supervision of high risk pregnancy, unspecified, unspecified trimester: Secondary | ICD-10-CM

## 2018-09-08 DIAGNOSIS — F17210 Nicotine dependence, cigarettes, uncomplicated: Secondary | ICD-10-CM | POA: Diagnosis not present

## 2018-09-08 DIAGNOSIS — Z3A35 35 weeks gestation of pregnancy: Secondary | ICD-10-CM | POA: Diagnosis not present

## 2018-09-08 DIAGNOSIS — O99333 Smoking (tobacco) complicating pregnancy, third trimester: Secondary | ICD-10-CM | POA: Diagnosis not present

## 2018-09-08 LAB — URINALYSIS, COMPLETE (UACMP) WITH MICROSCOPIC
Bacteria, UA: NONE SEEN
Bilirubin Urine: NEGATIVE
Glucose, UA: NEGATIVE mg/dL
Hgb urine dipstick: NEGATIVE
Ketones, ur: NEGATIVE mg/dL
Nitrite: NEGATIVE
Protein, ur: NEGATIVE mg/dL
Specific Gravity, Urine: 1.021 (ref 1.005–1.030)
pH: 6 (ref 5.0–8.0)

## 2018-09-08 MED ORDER — OXYCODONE HCL 5 MG PO TABS: 5.0000 mg | ORAL_TABLET | ORAL | Status: DC

## 2018-09-08 NOTE — Discharge Summary (Signed)
See Final progress note 

## 2018-09-08 NOTE — OB Triage Note (Signed)
Recvd pt via EMS. Pt states that she has had lower back pain for a week. She states she has lower left sided abdominal pain.  on and saw a scant amount of blood on toilet paper about an hour and a half ago. No intercourse in the past 24 hours. Feeling baby move well.

## 2018-09-08 NOTE — Final Progress Note (Signed)
Physician Final Progress Note  Patient ID: Colletta Wolin MRN: [id number] DOB/AGE: [birth year] 18 y.o.  Admit date: 09/08/2018 Admitting provider: Conard Novak, MD Discharge date: 09/08/2018   Admission Diagnoses:  1) back pain in pregnancy, third trimester 2) intrauterine pregnancy at [redacted]w[redacted]d   Discharge Diagnoses:  1) back pain in pregnancy, third trimester 2) intrauterine pregnancy at [redacted]w[redacted]d   History of Present Illness: The patient is a 18 y.o. female [phone] at [redacted]w[redacted]d who presents for back pain that started yesterday.  She describes the back pain as low in her back, radiating around to the front.  She rates the back pain as moderate. The pain is a pressure-like pain.  She notes no alleviating factors.  Walking aggravates her pain. She notes no associated symptoms. She notes +FM, no LOF, no contractions.  She noted a very small amount of blood on the tissue when she wiped earlier today and this is what brought her in to the hospital. She denies recent intercourse. She denies abdominal pain. She has had no subsequent nor previous bleeding.  She has no vaginal symptoms of itching, burning, and irritation. Her discharge is unchanged from her baseline.   Past Medical History:  Diagnosis Date  . Medical history non-contributory   . Miscarriage     Past Surgical History:  Procedure Laterality Date  . CESAREAN SECTION  02/08/2016  . DILATION AND CURETTAGE OF UTERUS    . wisom teeth      No current facility-administered medications on file prior to encounter.    Current Outpatient Medications on File Prior to Encounter  Medication Sig Dispense Refill  . Prenatal Vit-Fe Fumarate-FA (PRENATAL MULTIVITAMIN) TABS tablet Take 1 tablet by mouth daily at 12 noon.      No Known Allergies  Social History   Socioeconomic History  . Marital status: Single    Spouse name: Ellie Lunch  . Number of children: Not on file  . Years of education: Not on file  . Highest education level: Not  on file  Occupational History  . Not on file  Social Needs  . Financial resource strain: Not hard at all  . Food insecurity:    Worry: Never true    Inability: Never true  . Transportation needs:    Medical: No    Non-medical: No  Tobacco Use  . Smoking status: Current Every Day Smoker    Packs/day: 0.25    Types: Cigarettes  . Smokeless tobacco: Never Used  Substance and Sexual Activity  . Alcohol use: Not Currently  . Drug use: Yes    Types: Marijuana    Comment: Pt states last use was 13 weeks  . Sexual activity: Yes    Birth control/protection: Injection    Comment: Depo  Lifestyle  . Physical activity:    Days per week: 3 days    Minutes per session: 30 min  . Stress: Not at all  Relationships  . Social connections:    Talks on phone: Once a week    Gets together: Once a week    Attends religious service: 1 to 4 times per year    Active member of club or organization: Not on file    Attends meetings of clubs or organizations: 1 to 4 times per year    Relationship status: Living with partner  . Intimate partner violence:    Fear of current or ex partner: No    Emotionally abused: No    Physically abused: No  Forced sexual activity: No  Other Topics Concern  . Not on file  Social History Narrative  . Not on file    Family History  Problem Relation Age of Onset  . Heart disease Paternal Grandmother      Review of Systems  Constitutional: Negative.   HENT: Negative.   Eyes: Negative.   Respiratory: Negative.   Cardiovascular: Negative.   Gastrointestinal: Negative.   Genitourinary: Negative.   Musculoskeletal: Positive for back pain. Negative for falls, joint pain, myalgias and neck pain.  Skin: Negative.   Neurological: Negative.   Psychiatric/Behavioral: Negative.      Physical Exam: BP 123/71 (BP Location: Left Arm)   Pulse 95   Temp 98.3 F (36.8 C)   Resp 16   LMP 01/01/2018 (Approximate)   Physical Exam Constitutional:      General:  She is not in acute distress.    Appearance: Normal appearance. She is well-developed.  HENT:     Head: Normocephalic and atraumatic.  Eyes:     General: No scleral icterus.    Conjunctiva/sclera: Conjunctivae normal.  Neck:     Musculoskeletal: Normal range of motion and neck supple.  Cardiovascular:     Rate and Rhythm: Normal rate and regular rhythm.     Heart sounds: No murmur. No friction rub. No gallop.   Pulmonary:     Effort: Pulmonary effort is normal. No respiratory distress.     Breath sounds: Normal breath sounds. No wheezing or rales.  Abdominal:     General: Bowel sounds are normal. There is no distension.     Palpations: Abdomen is soft. There is no mass.     Tenderness: There is no abdominal tenderness. There is no right CVA tenderness, left CVA tenderness, guarding or rebound.     Comments: Pain along the iliac crest bilaterally to palpation  Musculoskeletal: Normal range of motion.  Neurological:     General: No focal deficit present.     Mental Status: She is alert and oriented to person, place, and time.     Cranial Nerves: No cranial nerve deficit.  Skin:    General: Skin is warm and dry.     Findings: No erythema.  Psychiatric:        Mood and Affect: Mood normal.        Behavior: Behavior normal.        Judgment: Judgment normal.     Consults: None  Significant Findings/ Diagnostic Studies:  Lab Results  Component Value Date   APPEARANCEUR HAZY (A) 09/08/2018   GLUCOSEU NEGATIVE 09/08/2018   BILIRUBINUR NEGATIVE 09/08/2018   KETONESUR NEGATIVE 09/08/2018   LABSPEC 1.021 09/08/2018   HGBUR NEGATIVE 09/08/2018   PHURINE 6.0 09/08/2018   NITRITE NEGATIVE 09/08/2018   LEUKOCYTESUR MODERATE (A) 09/08/2018   RBCU 0-5 09/08/2018   WBCU 0-5 09/08/2018   BACTERIA NONE SEEN 09/08/2018   EPIU 6-10 09/08/2018   CAOXCRYSTALS PRESENT 09/08/2018     Procedures:  NST: Baseline FHR: 135 beats/min Variability: moderate Accelerations:  present Decelerations: absent Tocometry: irritability  Interpretation:  INDICATIONS: rule out uterine contractions RESULTS:  A NST procedure was performed with FHR monitoring and a normal baseline established, appropriate time of 20-40 minutes of evaluation, and accels >2 seen w 15x15 characteristics.  Results show a REACTIVE NST.    Hospital Course: The patient was admitted to Labor and Delivery Triage for observation. She had normal vital signs. She had a reactive fetal heart rate tracing.  Her exam was  not concerning for abruption or other pathology.  She had no evidence of any treatable pathology.  We discussed vaginal bleeding in the setting of pregnancy.  If she has any further episodes, especially if the amount is more, she is to let us know.  Discharge Condition: stable  Disposition: Discharge disposition: 01-Home or Self Care       Diet: Regular diet  Discharge Activity: Activity as tolerated   Allergies as of 09/08/2018   No Known Allergies     Medication List    TAKE these medications   prenatal multivitamin Tabs tablet Take 1 tablet by mouth daily at 12 noon.        Total time spent taking care of this patient: 30 minutes  Signed: Thomasene Mohair, MD  09/08/2018, 10:37 PM

## 2018-09-08 NOTE — Discharge Instructions (Signed)

## 2018-09-14 ENCOUNTER — Ambulatory Visit (INDEPENDENT_AMBULATORY_CARE_PROVIDER_SITE_OTHER): Payer: Medicaid Other | Admitting: Obstetrics & Gynecology

## 2018-09-14 ENCOUNTER — Encounter: Payer: Self-pay | Admitting: Obstetrics & Gynecology

## 2018-09-14 ENCOUNTER — Other Ambulatory Visit: Payer: Self-pay

## 2018-09-14 DIAGNOSIS — Z3A36 36 weeks gestation of pregnancy: Secondary | ICD-10-CM

## 2018-09-14 DIAGNOSIS — O0933 Supervision of pregnancy with insufficient antenatal care, third trimester: Secondary | ICD-10-CM

## 2018-09-14 DIAGNOSIS — O0930 Supervision of pregnancy with insufficient antenatal care, unspecified trimester: Secondary | ICD-10-CM

## 2018-09-14 DIAGNOSIS — O0990 Supervision of high risk pregnancy, unspecified, unspecified trimester: Secondary | ICD-10-CM

## 2018-09-14 NOTE — Progress Notes (Signed)
Virtual Visit via Telephone Note  I connected with patient on 09/14/18 at  2:10 PM EDT by telephone and verified that I am speaking with the correct person using two identifiers.   I discussed the limitations, risks, security and privacy concerns of performing an evaluation and management service by telephone and the availability of in person appointments. I also discussed with the patient that there may be a patient responsible charge related to this service. The patient expressed understanding and agreed to proceed.  The patient was at home (Child care issues and so wanted this appt by phone) I spoke with the patient from my  office  Michelle Nelson is a 18 y.o. [id number] at [redacted]w[redacted]d being seen today for ongoing prenatal care.  She is currently monitored for the following issues for this high-risk pregnancy and has Pyelonephritis; Supervision of high risk pregnancy, antepartum; Late prenatal care; and Labor and delivery, indication for care on their problem list.  ----------------------------------------------------------------------------------- Patient reports no complaints.  Improved pains, no VB or ROM or signs of labor.  Some heart burn.  No h/a, blurry vision, CP, SOB, edema. Denies pain, VB, leaking of fluid.  ----------------------------------------------------------------------------------- The following portions of the patient's history were reviewed and updated as appropriate: allergies, current medications, past family history, past medical history, past social history, past surgical history and problem list. Problem list updated.   Objective  Last menstrual period 01/01/2018. Pregravid weight 165 lb (74.8 kg) Total Weight Gain 21 lb (9.526 kg)  Physical Exam could not be performed. Because of the COVID-19 outbreak this visit was performed over the phone and not in person.   Assessment   18 y.o. [id number] at [redacted]w[redacted]d by  10/08/2018, by Last Menstrual Period presenting for routine prenatal  visit  Plan   pregnancy #3 Problems (from 04/06/18 to present)    Problem Noted Resolved   Supervision of high risk pregnancy, antepartum    Late onset to care, Teen pregnancy   No   Overview Addendum 08/31/2018  2:00 PM by Oswaldo Conroy, CNM    Clinic [facility] Prenatal Labs  Dating Late [place] Blood type:   O+  Genetic Screen None Antibody: neg1  Anatomic [place] [facility] Rubella: imm  Varicella: imm  GTT Early:               Third trimester:  RPR: Non Reactive (04/22 1536)   Rhogam O+ HBsAg:   neg  TDaP vaccine   08/01/18                 HIV: Non Reactive (04/22 1536)   Baby Food Both                               GBS: not done yet  Contraception Uncertain Pap:<21 yo  CBB  No   CS/VBAC 35 week in 2017, scheduled for Jun23 for repeat   Support Person Dee Hx baby with gastroschesis           PNV, FMC, PTL precautions  GBS next in office visit  Gestational age appropriate obstetric precautions including but not limited to vaginal bleeding, contractions, leaking of fluid and fetal movement were reviewed in detail with the patient.     Follow Up Instructions: 1 week   I discussed the assessment and treatment plan with the patient. The patient was provided an opportunity to ask questions and all were answered. The patient agreed with the plan and demonstrated  an understanding of the instructions.   The patient was advised to call back or seek an in-person evaluation if the symptoms worsen or if the condition fails to improve as anticipated.  I provided 5 minutes of non-face-to-face time during this encounter.  Return in about 1 week (around 09/21/2018) for ROB- - -  SJ.  Annamarie MajorPaul Mackie Holness, MD [facility], [facility] 09/14/2018 1:46 PM

## 2018-09-25 ENCOUNTER — Encounter: Payer: Self-pay | Admitting: Obstetrics and Gynecology

## 2018-09-25 ENCOUNTER — Ambulatory Visit (INDEPENDENT_AMBULATORY_CARE_PROVIDER_SITE_OTHER): Payer: Medicaid Other | Admitting: Obstetrics and Gynecology

## 2018-09-25 ENCOUNTER — Other Ambulatory Visit: Payer: Self-pay

## 2018-09-25 ENCOUNTER — Other Ambulatory Visit (HOSPITAL_COMMUNITY)
Admission: RE | Admit: 2018-09-25 | Discharge: 2018-09-25 | Disposition: A | Discharge status: Home / Self Care | Payer: Medicaid Other | Source: Ambulatory Visit | Attending: Obstetrics and Gynecology | Admitting: Obstetrics and Gynecology

## 2018-09-25 VITALS — BP 122/74 | Ht 65.0 in | Wt 190.0 lb

## 2018-09-25 DIAGNOSIS — O0990 Supervision of high risk pregnancy, unspecified, unspecified trimester: Secondary | ICD-10-CM | POA: Diagnosis not present

## 2018-09-25 DIAGNOSIS — Z3A38 38 weeks gestation of pregnancy: Secondary | ICD-10-CM

## 2018-09-25 DIAGNOSIS — O0930 Supervision of pregnancy with insufficient antenatal care, unspecified trimester: Secondary | ICD-10-CM

## 2018-09-25 DIAGNOSIS — O0933 Supervision of pregnancy with insufficient antenatal care, third trimester: Secondary | ICD-10-CM

## 2018-09-25 DIAGNOSIS — O34219 Maternal care for unspecified type scar from previous cesarean delivery: Secondary | ICD-10-CM | POA: Insufficient documentation

## 2018-09-25 DIAGNOSIS — O0993 Supervision of high risk pregnancy, unspecified, third trimester: Secondary | ICD-10-CM

## 2018-09-25 LAB — OB RESULTS CONSOLE GC/CHLAMYDIA: Gonorrhea: NEGATIVE

## 2018-09-25 NOTE — H&P (View-Only) (Signed)
OB History & Physical   History of Present Illness:  Chief Complaint: here for pre-operative visit  HPI:  Michelle Nelson is a 18 y.o. G3P0111 female at [redacted]w[redacted]d dated by LMP consistent with 12 week ultrasound.  Her pregnancy has been complicated by late entry into prenatal care, history of cesarean delivery, pyelonephritis this pregnancy.    She denies contractions.   She denies leakage of fluid.   She denies vaginal bleeding.   She reports fetal movement.    Maternal Medical History:   Past Medical History:  Diagnosis Date  . Medical history non-contributory   . Miscarriage     Past Surgical History:  Procedure Laterality Date  . CESAREAN SECTION  02/08/2016  . DILATION AND CURETTAGE OF UTERUS    . wisom teeth      No Known Allergies  Prior to Admission medications   Medication Sig Start Date End Date Taking? Authorizing Provider  calcium carbonate (TUMS - DOSED IN MG ELEMENTAL CALCIUM) 500 MG chewable tablet Chew 1 tablet by mouth 2 (two) times daily as needed for indigestion or heartburn.    [provider]  Prenatal Vit-Fe Fumarate-FA (PRENATAL MULTIVITAMIN) TABS tablet Take 1 tablet by mouth daily at 12 noon.    [provider]    OB History  Gravida Para Term Preterm AB Living  3 1   1 1 1  SAB TAB Ectopic Multiple Live Births  1       1    # Outcome Date GA Lbr Len/2nd Weight Sex Delivery Anes PTL Lv  3 Current           2 Preterm 02/08/16 [redacted]w[redacted]d  4 lb 9 oz (2.07 kg) F CS-LTranv   LIV     Complications: Non-reassuring fetal heart rate or rhythm affecting management of mother, Gastroschisis of fetus in singleton pregnancy, antepartum  1 SAB  [redacted]w[redacted]d    SAB       Prenatal care site: [facility]  Social History: She  reports that she has been smoking cigarettes. She has been smoking about 0.25 packs per day. She has never used smokeless tobacco. She reports previous alcohol use. She reports current drug use. Drug: Marijuana.  Family History: family  history includes Heart disease in her paternal grandmother.   Review of Systems:  Review of Systems  Constitutional: Negative.   HENT: Negative.   Eyes: Negative.   Respiratory: Negative.   Cardiovascular: Negative.   Gastrointestinal: Negative.   Genitourinary: Negative.   Musculoskeletal: Negative.   Skin: Negative.   Neurological: Negative.   Psychiatric/Behavioral: Negative.      Physical Exam:  Physical Exam Constitutional:      General: She is not in acute distress.    Appearance: Normal appearance. She is well-developed.  HENT:     Head: Normocephalic and atraumatic.  Eyes:     General: No scleral icterus.    Conjunctiva/sclera: Conjunctivae normal.  Neck:     Musculoskeletal: Normal range of motion and neck supple.  Cardiovascular:     Rate and Rhythm: Normal rate and regular rhythm.     Heart sounds: No murmur. No friction rub. No gallop.   Pulmonary:     Effort: Pulmonary effort is normal. No respiratory distress.     Breath sounds: Normal breath sounds. No wheezing or rales.  Abdominal:     General: Bowel sounds are normal. There is no distension.     Palpations: Abdomen is soft. There is no mass.       Tenderness: There is no abdominal tenderness. There is no guarding or rebound.  Musculoskeletal: Normal range of motion.  Neurological:     General: No focal deficit present.     Mental Status: She is alert and oriented to person, place, and time.     Cranial Nerves: No cranial nerve deficit.  Skin:    General: Skin is warm and dry.     Findings: No erythema.  Psychiatric:        Mood and Affect: Mood normal.        Behavior: Behavior normal.        Judgment: Judgment normal.      Pertinent Results:  Prenatal Labs: Blood type/Rh O positive  Antibody screen negative  Rubella Immune  Varicella Immune    RPR NR  HBsAg negative  HIV negative  GC negative  Chlamydia negative  Genetic screening Not done  1 hour GTT 93  3 hour GTT n/a  GBS  no GBS  done   FHR 130 bpm  No results found for: [id number]]  Assessment:  Michelle Nelson is a 18 y.o. [id number] female at [redacted]w[redacted]d with history of cesarean section, desires repeat.   Plan:  1. Admit to Labor & Delivery  2. CBC, T&S, NPO, IVF 3. GBS pending - collected today.   4. Fetwal well-being: reassuring 5. covid testing on 6/19.  Patient explained directions on how to accomplish this.    Prentice Docker, MD 09/25/2018 9:52 AM

## 2018-09-25 NOTE — Addendum Note (Signed)
Addended by: Prentice Docker D on: 09/25/2018 10:10 AM   Modules accepted: Orders

## 2018-09-25 NOTE — Progress Notes (Signed)
OB History & Physical   History of Present Illness:  Chief Complaint: here for pre-operative visit  HPI:  Michelle Nelson is a 18 y.o. [id number] female at 2572w1d dated by LMP consistent with 12 week ultrasound.  Her pregnancy has been complicated by late entry into prenatal care, history of cesarean delivery, pyelonephritis this pregnancy.    She denies contractions.   She denies leakage of fluid.   She denies vaginal bleeding.   She reports fetal movement.    Maternal Medical History:   Past Medical History:  Diagnosis Date  . Medical history non-contributory   . Miscarriage     Past Surgical History:  Procedure Laterality Date  . CESAREAN SECTION  02/08/2016  . DILATION AND CURETTAGE OF UTERUS    . wisom teeth      No Known Allergies  Prior to Admission medications   Medication Sig Start Date End Date Taking? Authorizing Provider  calcium carbonate (TUMS - DOSED IN MG ELEMENTAL CALCIUM) 500 MG chewable tablet Chew 1 tablet by mouth 2 (two) times daily as needed for indigestion or heartburn.    [provider]  Prenatal Vit-Fe Fumarate-FA (PRENATAL MULTIVITAMIN) TABS tablet Take 1 tablet by mouth daily at 12 noon.    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 She has been smoking about 0.25 packs per day. She has never used smokeless tobacco. She reports previous alcohol use. She reports current drug use. Drug: Marijuana.  Family History: family  history includes Heart disease in her paternal grandmother.   Review of Systems:  Review of Systems  Constitutional: Negative.   HENT: Negative.   Eyes: Negative.   Respiratory: Negative.   Cardiovascular: Negative.   Gastrointestinal: Negative.   Genitourinary: Negative.   Musculoskeletal: Negative.   Skin: Negative.   Neurological: Negative.   Psychiatric/Behavioral: Negative.      Physical Exam:  Physical Exam Constitutional:      General: She is not in acute distress.    Appearance: Normal appearance. She is well-developed.  HENT:     Head: Normocephalic and atraumatic.  Eyes:     General: No scleral icterus.    Conjunctiva/sclera: Conjunctivae normal.  Neck:     Musculoskeletal: Normal range of motion and neck supple.  Cardiovascular:     Rate and Rhythm: Normal rate and regular rhythm.     Heart sounds: No murmur. No friction rub. No gallop.   Pulmonary:     Effort: Pulmonary effort is normal. No respiratory distress.     Breath sounds: Normal breath sounds. No wheezing or rales.  Abdominal:     General: Bowel sounds are normal. There is no distension.     Palpations: Abdomen is soft. There is no mass.  Tenderness: There is no abdominal tenderness. There is no guarding or rebound.  Musculoskeletal: Normal range of motion.  Neurological:     General: No focal deficit present.     Mental Status: She is alert and oriented to person, place, and time.     Cranial Nerves: No cranial nerve deficit.  Skin:    General: Skin is warm and dry.     Findings: No erythema.  Psychiatric:        Mood and Affect: Mood normal.        Behavior: Behavior normal.        Judgment: Judgment normal.      Pertinent Results:  Prenatal Labs: Blood type/Rh O positive  Antibody screen negative  Rubella Immune  Varicella Immune    RPR NR  HBsAg negative  HIV negative  GC negative  Chlamydia negative  Genetic screening Not done  1 hour GTT 93  3 hour GTT n/a  GBS  no GBS  done   FHR 130 bpm  No results found for: SARSCOV2NAA]  Assessment:  Michelle Nelson is a 18 y.o. O9G2952 female at [redacted]w[redacted]d with history of cesarean section, desires repeat.   Plan:  1. Admit to Labor & Delivery  2. CBC, T&S, NPO, IVF 3. GBS pending - collected today.   4. Fetwal well-being: reassuring 5. covid testing on 6/19.  Patient explained directions on how to accomplish this.    Prentice Docker, MD 09/25/2018 9:52 AM

## 2018-09-26 ENCOUNTER — Encounter
Admission: RE | Admit: 2018-09-26 | Discharge: 2018-09-26 | Disposition: A | Discharge status: Home / Self Care | Payer: Medicaid Other | Source: Ambulatory Visit | Attending: Obstetrics and Gynecology | Admitting: Obstetrics and Gynecology

## 2018-09-26 ENCOUNTER — Other Ambulatory Visit: Payer: Self-pay

## 2018-09-26 DIAGNOSIS — Z01818 Encounter for other preprocedural examination: Secondary | ICD-10-CM | POA: Diagnosis not present

## 2018-09-26 HISTORY — DX: Headache, unspecified: R51.9

## 2018-09-26 HISTORY — DX: Gastro-esophageal reflux disease without esophagitis: K21.9

## 2018-09-26 HISTORY — DX: Other complications of anesthesia, initial encounter: T88.59XA

## 2018-09-26 HISTORY — DX: Other specified postprocedural states: Z98.890

## 2018-09-26 HISTORY — DX: Anxiety disorder, unspecified: F41.9

## 2018-09-26 HISTORY — DX: Other specified postprocedural states: R11.2

## 2018-09-26 LAB — CERVICOVAGINAL ANCILLARY ONLY
Chlamydia: NEGATIVE
Neisseria Gonorrhea: NEGATIVE

## 2018-09-26 NOTE — Patient Instructions (Signed)
Your procedure is scheduled on: 10-02-18 TUESDAY Report to [facility] TO LABOR AND DELIVERY ON THE 3RD FLOOR @ 5:30 AM  Remember: Instructions that are not followed completely may result in serious medical risk, up to and including death, or upon the discretion of your surgeon and anesthesiologist your surgery may need to be rescheduled.    _x___ 1. Do not eat food after midnight the night before your procedure. NO GUM OR CANDY AFTER MIDNIGHT. You may drink clear liquids up to 2 hours before you are scheduled to arrive at the hospital for your procedure.  Do not drink clear liquids within 2 hours of your scheduled arrival to the hospital.  Clear liquids include  --Water or Apple juice without pulp  --Clear carbohydrate beverage such as ClearFast or Gatorade  --Black Coffee or Clear Tea (No milk, no creamers, do not add anything to the coffee or Tea   ____Ensure clear carbohydrate drink on the way to the hospital for bariatric patients  ____Ensure clear carbohydrate drink 3 hours before surgery for Dr Dwyane Luo patients if physician instructed.     __x__ 2. No Alcohol for 24 hours before or after surgery.   __x__3. No Smoking or e-cigarettes for 24 prior to surgery.  Do not use any chewable tobacco products for at least 6 hour prior to surgery   ____  4. Bring all medications with you on the day of surgery if instructed.    __x__ 5. Notify your doctor if there is any change in your medical condition     (cold, fever, infections).    x___6. On the morning of surgery brush your teeth with toothpaste and water.  You may rinse your mouth with mouth wash if you wish.  Do not swallow any toothpaste or mouthwash.   Do not wear jewelry, make-up, hairpins, clips or nail polish.  Do not wear lotions, powders, or perfumes. You may wear deodorant.  Do not shave 48 hours prior to surgery. Men may shave face and neck.  Do not bring valuables to the hospital.    [facility] is not responsible for any belongings or valuables.               Contacts, dentures or bridgework may not be worn into surgery.  Leave your suitcase in the car. After surgery it may be brought to your room.  For patients admitted to the hospital, discharge time is determined by your  treatment team.  _  Patients discharged the day of surgery will not be allowed to drive home.  You will need someone to drive you home and stay with you the night of your procedure.    Please read over the following fact sheets that you were given:   [facility] Preparing for Surgery  ____ Take anti-hypertensive listed below, cardiac, seizure, asthma,anti-reflux and psychiatric medicines. These include:  1. NONE  2.  3.  4.  5.  6.  ____Fleets enema or Magnesium Citrate as directed.   _x___ Use CHG Soap or sage wipes as directed on instruction sheet-AVOID PRIVATE PARTS AND NIPPLE AREA  ____ Use inhalers on the day of surgery and bring to hospital day of surgery  ____ Stop Metformin and Janumet 2 days prior to surgery.    ____ Take 1/2 of usual insulin dose the night before surgery and none on the morning surgery.   ____ Follow recommendations from Cardiologist, Pulmonologist or PCP regarding stopping Aspirin, Coumadin, Plavix ,Eliquis, Effient, or  Pradaxa, and Pletal.  X____Stop Anti-inflammatories such as Advil, Aleve, Ibuprofen, Motrin, Naproxen, Naprosyn, Goodies powders or aspirin products NOW- OK to take Tylenol   ____ Stop supplements until after surgery.    ____ Bring C-Pap to the hospital.

## 2018-09-27 LAB — STREP GP B NAA: Strep Gp B NAA: NEGATIVE

## 2018-09-28 ENCOUNTER — Telehealth: Payer: Self-pay

## 2018-09-28 ENCOUNTER — Other Ambulatory Visit: Payer: Self-pay

## 2018-09-28 ENCOUNTER — Ambulatory Visit
Admission: RE | Admit: 2018-09-28 | Discharge: 2018-09-28 | Disposition: A | Discharge status: Home / Self Care | Payer: Medicaid Other | Source: Ambulatory Visit | Attending: Obstetrics and Gynecology | Admitting: Obstetrics and Gynecology

## 2018-09-28 DIAGNOSIS — Z1159 Encounter for screening for other viral diseases: Secondary | ICD-10-CM | POA: Diagnosis not present

## 2018-09-28 NOTE — Telephone Encounter (Signed)
Pt is supposed to go in today to get the covid test before c/s on Tuesday.  She can't find a way there.  What to do?  What happens fi she can't get there?  [phone]

## 2018-09-28 NOTE — Telephone Encounter (Signed)
Per patient, she found a ride, was tested, and just got back home. Patient has instructions for Tuesday.

## 2018-09-29 LAB — NOVEL CORONAVIRUS, NAA (HOSP ORDER, SEND-OUT TO REF LAB; TAT 18-24 HRS): SARS-CoV-2, NAA: NOT DETECTED

## 2018-10-02 ENCOUNTER — Inpatient Hospital Stay
Admission: RE | Admit: 2018-10-02 | Discharge: 2018-10-04 | DRG: 787 | Disposition: A | Discharge status: Home / Self Care | Payer: Medicaid Other | Attending: Obstetrics and Gynecology | Admitting: Obstetrics and Gynecology

## 2018-10-02 ENCOUNTER — Inpatient Hospital Stay: Payer: Medicaid Other | Admitting: Anesthesiology

## 2018-10-02 ENCOUNTER — Encounter
Admission: RE | Disposition: A | Discharge status: Home / Self Care | Payer: Self-pay | Source: Home / Self Care | Attending: Obstetrics and Gynecology

## 2018-10-02 ENCOUNTER — Other Ambulatory Visit: Payer: Self-pay

## 2018-10-02 DIAGNOSIS — O34211 Maternal care for low transverse scar from previous cesarean delivery: Principal | ICD-10-CM | POA: Diagnosis present

## 2018-10-02 DIAGNOSIS — O9081 Anemia of the puerperium: Secondary | ICD-10-CM | POA: Diagnosis not present

## 2018-10-02 DIAGNOSIS — O99334 Smoking (tobacco) complicating childbirth: Secondary | ICD-10-CM | POA: Diagnosis present

## 2018-10-02 DIAGNOSIS — O0990 Supervision of high risk pregnancy, unspecified, unspecified trimester: Secondary | ICD-10-CM

## 2018-10-02 DIAGNOSIS — Z3A38 38 weeks gestation of pregnancy: Secondary | ICD-10-CM

## 2018-10-02 DIAGNOSIS — O34219 Maternal care for unspecified type scar from previous cesarean delivery: Secondary | ICD-10-CM

## 2018-10-02 DIAGNOSIS — D62 Acute posthemorrhagic anemia: Secondary | ICD-10-CM | POA: Diagnosis not present

## 2018-10-02 DIAGNOSIS — F17210 Nicotine dependence, cigarettes, uncomplicated: Secondary | ICD-10-CM | POA: Diagnosis present

## 2018-10-02 DIAGNOSIS — O0930 Supervision of pregnancy with insufficient antenatal care, unspecified trimester: Secondary | ICD-10-CM

## 2018-10-02 DIAGNOSIS — Z98891 History of uterine scar from previous surgery: Secondary | ICD-10-CM

## 2018-10-02 DIAGNOSIS — Z3A39 39 weeks gestation of pregnancy: Secondary | ICD-10-CM

## 2018-10-02 LAB — RAPID HIV SCREEN (HIV 1/2 AB+AG)
HIV 1/2 Antibodies: NONREACTIVE
HIV-1 P24 Antigen - HIV24: NONREACTIVE

## 2018-10-02 LAB — CBC
HCT: 31.5 % — ABNORMAL LOW (ref 36.0–46.0)
Hemoglobin: 10.9 g/dL — ABNORMAL LOW (ref 12.0–15.0)
MCH: 29.7 pg (ref 26.0–34.0)
MCHC: 34.6 g/dL (ref 30.0–36.0)
MCV: 85.8 fL (ref 80.0–100.0)
Platelets: 302 10*3/uL (ref 150–400)
RBC: 3.67 MIL/uL — ABNORMAL LOW (ref 3.87–5.11)
RDW: 13 % (ref 11.5–15.5)
WBC: 24.3 10*3/uL — ABNORMAL HIGH (ref 4.0–10.5)
nRBC: 0 % (ref 0.0–0.2)

## 2018-10-02 LAB — ABO/RH: ABO/RH(D): O POS

## 2018-10-02 LAB — TYPE AND SCREEN
ABO/RH(D): O POS
Antibody Screen: NEGATIVE

## 2018-10-02 SURGERY — Surgical Case
Anesthesia: Spinal

## 2018-10-02 MED ORDER — MEPERIDINE HCL 50 MG/ML IJ SOLN: 6.2500 mg | INTRAMUSCULAR | Status: DC | PRN

## 2018-10-02 MED ORDER — NALBUPHINE HCL 10 MG/ML IJ SOLN
5.0000 mg | INTRAMUSCULAR | Status: DC | PRN
  Filled 2018-10-02: qty 1

## 2018-10-02 MED ORDER — MORPHINE SULFATE (PF) 0.5 MG/ML IJ SOLN
INTRAMUSCULAR | Status: AC
  Filled 2018-10-02: qty 10

## 2018-10-02 MED ORDER — KETOROLAC TROMETHAMINE 30 MG/ML IJ SOLN
30.0000 mg | Freq: Four times a day (QID) | INTRAMUSCULAR | Status: DC
  Administered 2018-10-02 (×3): 30 mg via INTRAVENOUS
  Filled 2018-10-02 (×3): qty 1

## 2018-10-02 MED ORDER — DIBUCAINE (PERIANAL) 1 % EX OINT: 1.0000 "application " | TOPICAL_OINTMENT | CUTANEOUS | Status: DC | PRN

## 2018-10-02 MED ORDER — NALBUPHINE HCL 10 MG/ML IJ SOLN: 5.0000 mg | INTRAMUSCULAR | Status: DC | PRN

## 2018-10-02 MED ORDER — OXYCODONE HCL 5 MG PO TABS
5.0000 mg | ORAL_TABLET | ORAL | Status: DC | PRN
  Administered 2018-10-02: 5 mg via ORAL
  Filled 2018-10-02: qty 1

## 2018-10-02 MED ORDER — LACTATED RINGERS IV SOLN
Freq: Once | INTRAVENOUS | Status: AC
  Administered 2018-10-02: 08:00:00 via INTRAVENOUS

## 2018-10-02 MED ORDER — DIPHENHYDRAMINE HCL 25 MG PO CAPS: 25.0000 mg | ORAL_CAPSULE | Freq: Four times a day (QID) | ORAL | Status: DC | PRN

## 2018-10-02 MED ORDER — ONDANSETRON HCL 4 MG/2ML IJ SOLN
INTRAMUSCULAR | Status: DC | PRN
  Administered 2018-10-02: 4 mg via INTRAVENOUS

## 2018-10-02 MED ORDER — SENNOSIDES-DOCUSATE SODIUM 8.6-50 MG PO TABS
2.0000 | ORAL_TABLET | ORAL | Status: DC
  Administered 2018-10-02 – 2018-10-03 (×2): 2 via ORAL
  Filled 2018-10-02 (×2): qty 2

## 2018-10-02 MED ORDER — LACTATED RINGERS IV SOLN: INTRAVENOUS | Status: DC

## 2018-10-02 MED ORDER — BUPIVACAINE 0.25 % ON-Q PUMP DUAL CATH 400 ML
400.0000 mL | INJECTION | Status: DC
  Filled 2018-10-02: qty 400

## 2018-10-02 MED ORDER — COCONUT OIL OIL: 1.0000 "application " | TOPICAL_OIL | Status: DC | PRN

## 2018-10-02 MED ORDER — PROPOFOL 10 MG/ML IV BOLUS
INTRAVENOUS | Status: AC
  Filled 2018-10-02: qty 20

## 2018-10-02 MED ORDER — BUPIVACAINE IN DEXTROSE 0.75-8.25 % IT SOLN
INTRATHECAL | Status: DC | PRN
  Administered 2018-10-02: 1.6 mL via INTRATHECAL

## 2018-10-02 MED ORDER — SIMETHICONE 80 MG PO CHEW
80.0000 mg | CHEWABLE_TABLET | Freq: Three times a day (TID) | ORAL | Status: DC
  Administered 2018-10-02 – 2018-10-04 (×7): 80 mg via ORAL
  Filled 2018-10-02 (×7): qty 1

## 2018-10-02 MED ORDER — IBUPROFEN 600 MG PO TABS
600.0000 mg | ORAL_TABLET | Freq: Four times a day (QID) | ORAL | Status: DC
  Administered 2018-10-03 – 2018-10-04 (×5): 600 mg via ORAL
  Filled 2018-10-02 (×5): qty 1

## 2018-10-02 MED ORDER — PHENYLEPHRINE 40 MCG/ML (10ML) SYRINGE FOR IV PUSH (FOR BLOOD PRESSURE SUPPORT)
PREFILLED_SYRINGE | INTRAVENOUS | Status: DC | PRN
  Administered 2018-10-02 (×3): 100 ug via INTRAVENOUS

## 2018-10-02 MED ORDER — LACTATED RINGERS IV SOLN
Freq: Once | INTRAVENOUS | Status: AC
  Administered 2018-10-02: 06:00:00 via INTRAVENOUS

## 2018-10-02 MED ORDER — OXYTOCIN 40 UNITS IN NORMAL SALINE INFUSION - SIMPLE MED
2.5000 [IU]/h | INTRAVENOUS | Status: DC
  Administered 2018-10-02 (×2): 2.5 [IU]/h via INTRAVENOUS
  Filled 2018-10-02: qty 1000

## 2018-10-02 MED ORDER — SOD CITRATE-CITRIC ACID 500-334 MG/5ML PO SOLN
30.0000 mL | ORAL | Status: AC
  Administered 2018-10-02: 30 mL via ORAL
  Filled 2018-10-02: qty 30

## 2018-10-02 MED ORDER — OXYCODONE HCL 5 MG PO TABS: 10.0000 mg | ORAL_TABLET | ORAL | Status: DC | PRN

## 2018-10-02 MED ORDER — KETOROLAC TROMETHAMINE 30 MG/ML IJ SOLN
30.0000 mg | Freq: Four times a day (QID) | INTRAMUSCULAR | Status: AC
  Administered 2018-10-03: 30 mg via INTRAVENOUS
  Filled 2018-10-02: qty 1

## 2018-10-02 MED ORDER — KETOROLAC TROMETHAMINE 30 MG/ML IJ SOLN: 30.0000 mg | Freq: Four times a day (QID) | INTRAMUSCULAR | Status: DC

## 2018-10-02 MED ORDER — WITCH HAZEL-GLYCERIN EX PADS: 1.0000 "application " | MEDICATED_PAD | CUTANEOUS | Status: DC | PRN

## 2018-10-02 MED ORDER — MENTHOL 3 MG MT LOZG
1.0000 | LOZENGE | OROMUCOSAL | Status: DC | PRN
  Filled 2018-10-02: qty 9

## 2018-10-02 MED ORDER — KETOROLAC TROMETHAMINE 30 MG/ML IJ SOLN: 30.0000 mg | Freq: Four times a day (QID) | INTRAMUSCULAR | Status: AC

## 2018-10-02 MED ORDER — NALOXONE HCL 0.4 MG/ML IJ SOLN
0.4000 mg | INTRAMUSCULAR | Status: DC | PRN
  Filled 2018-10-02: qty 1

## 2018-10-02 MED ORDER — NALBUPHINE HCL 10 MG/ML IJ SOLN
5.0000 mg | Freq: Once | INTRAMUSCULAR | Status: AC | PRN
  Administered 2018-10-02: 5 mg via INTRAVENOUS

## 2018-10-02 MED ORDER — ONDANSETRON HCL 4 MG/2ML IJ SOLN: 4.0000 mg | Freq: Three times a day (TID) | INTRAMUSCULAR | Status: DC | PRN

## 2018-10-02 MED ORDER — NALBUPHINE HCL 10 MG/ML IJ SOLN: 5.0000 mg | Freq: Once | INTRAMUSCULAR | Status: AC | PRN

## 2018-10-02 MED ORDER — BUPIVACAINE HCL (PF) 0.5 % IJ SOLN: 5.0000 mL | Freq: Once | INTRAMUSCULAR | Status: DC

## 2018-10-02 MED ORDER — FENTANYL CITRATE (PF) 100 MCG/2ML IJ SOLN
INTRAMUSCULAR | Status: DC | PRN
  Administered 2018-10-02: 15 ug via INTRAVENOUS

## 2018-10-02 MED ORDER — BUPIVACAINE HCL (PF) 0.5 % IJ SOLN
5.0000 mL | Freq: Once | INTRAMUSCULAR | Status: DC
  Filled 2018-10-02: qty 30

## 2018-10-02 MED ORDER — FERROUS SULFATE 325 (65 FE) MG PO TABS
325.0000 mg | ORAL_TABLET | Freq: Two times a day (BID) | ORAL | Status: DC
  Administered 2018-10-02 – 2018-10-04 (×4): 325 mg via ORAL
  Filled 2018-10-02 (×4): qty 1

## 2018-10-02 MED ORDER — SODIUM CHLORIDE 0.9 % IV SOLN
INTRAVENOUS | Status: DC | PRN
  Administered 2018-10-02: 50 ug/min via INTRAVENOUS

## 2018-10-02 MED ORDER — SODIUM CHLORIDE 0.9% FLUSH: 3.0000 mL | INTRAVENOUS | Status: DC | PRN

## 2018-10-02 MED ORDER — OXYTOCIN 40 UNITS IN NORMAL SALINE INFUSION - SIMPLE MED
INTRAVENOUS | Status: AC
  Filled 2018-10-02: qty 1000

## 2018-10-02 MED ORDER — ACETAMINOPHEN 325 MG PO TABS
650.0000 mg | ORAL_TABLET | Freq: Four times a day (QID) | ORAL | Status: AC
  Administered 2018-10-03: 650 mg via ORAL
  Filled 2018-10-02: qty 2

## 2018-10-02 MED ORDER — PRENATAL MULTIVITAMIN CH
1.0000 | ORAL_TABLET | Freq: Every day | ORAL | Status: DC
  Administered 2018-10-02 – 2018-10-04 (×3): 1 via ORAL
  Filled 2018-10-02 (×3): qty 1

## 2018-10-02 MED ORDER — MORPHINE SULFATE (PF) 0.5 MG/ML IJ SOLN
INTRAMUSCULAR | Status: DC | PRN
  Administered 2018-10-02: .1 mg via EPIDURAL

## 2018-10-02 MED ORDER — OXYCODONE-ACETAMINOPHEN 5-325 MG PO TABS
1.0000 | ORAL_TABLET | ORAL | Status: DC | PRN
  Administered 2018-10-03: 1 via ORAL
  Filled 2018-10-02: qty 1

## 2018-10-02 MED ORDER — OXYCODONE-ACETAMINOPHEN 5-325 MG PO TABS
2.0000 | ORAL_TABLET | ORAL | Status: DC | PRN
  Administered 2018-10-03 – 2018-10-04 (×4): 2 via ORAL
  Filled 2018-10-02 (×4): qty 2

## 2018-10-02 MED ORDER — FENTANYL CITRATE (PF) 100 MCG/2ML IJ SOLN
INTRAMUSCULAR | Status: AC
  Filled 2018-10-02: qty 2

## 2018-10-02 MED ORDER — CEFAZOLIN SODIUM-DEXTROSE 2-4 GM/100ML-% IV SOLN
2.0000 g | INTRAVENOUS | Status: AC
  Administered 2018-10-02: 2 g via INTRAVENOUS
  Filled 2018-10-02: qty 100

## 2018-10-02 MED ORDER — DIPHENHYDRAMINE HCL 50 MG/ML IJ SOLN: 12.5000 mg | INTRAMUSCULAR | Status: DC | PRN

## 2018-10-02 MED ORDER — ACETAMINOPHEN 325 MG PO TABS
650.0000 mg | ORAL_TABLET | Freq: Four times a day (QID) | ORAL | Status: DC
  Administered 2018-10-02 (×3): 650 mg via ORAL
  Filled 2018-10-02 (×3): qty 2

## 2018-10-02 MED ORDER — OXYTOCIN 40 UNITS IN NORMAL SALINE INFUSION - SIMPLE MED
INTRAVENOUS | Status: DC | PRN
  Administered 2018-10-02: 1000 mL via INTRAVENOUS

## 2018-10-02 MED ORDER — BUPIVACAINE HCL 0.5 % IJ SOLN
INTRAMUSCULAR | Status: DC | PRN
  Administered 2018-10-02: 10 mL

## 2018-10-02 MED ORDER — DIPHENHYDRAMINE HCL 25 MG PO CAPS: 25.0000 mg | ORAL_CAPSULE | ORAL | Status: DC | PRN

## 2018-10-02 SURGICAL SUPPLY — 34 items
CANISTER SUCT 3000ML PPV (MISCELLANEOUS) ×3 IMPLANT
CATH KIT ON-Q SILVERSOAK 5 (CATHETERS) ×2 IMPLANT
CATH KIT ON-Q SILVERSOAK 5IN (CATHETERS) ×6 IMPLANT
CLOSURE WOUND 1/2 X4 (GAUZE/BANDAGES/DRESSINGS) ×1
COVER WAND RF STERILE (DRAPES) ×3 IMPLANT
DERMABOND ADVANCED (GAUZE/BANDAGES/DRESSINGS) ×2
DERMABOND ADVANCED .7 DNX12 (GAUZE/BANDAGES/DRESSINGS) ×1 IMPLANT
DRSG OPSITE POSTOP 4X10 (GAUZE/BANDAGES/DRESSINGS) ×3 IMPLANT
DRSG TELFA 3X8 NADH (GAUZE/BANDAGES/DRESSINGS) ×3 IMPLANT
ELECT CAUTERY BLADE 6.4 (BLADE) ×1 IMPLANT
ELECT REM PT RETURN 9FT ADLT (ELECTROSURGICAL) ×3
ELECTRODE REM PT RTRN 9FT ADLT (ELECTROSURGICAL) ×1 IMPLANT
GAUZE SPONGE 4X4 12PLY STRL (GAUZE/BANDAGES/DRESSINGS) ×3 IMPLANT
GLOVE BIO SURGEON STRL SZ7 (GLOVE) ×3 IMPLANT
GLOVE INDICATOR 7.5 STRL GRN (GLOVE) ×3 IMPLANT
GOWN STRL REUS W/ TWL LRG LVL3 (GOWN DISPOSABLE) ×3 IMPLANT
GOWN STRL REUS W/TWL LRG LVL3 (GOWN DISPOSABLE) ×6
NS IRRIG 1000ML POUR BTL (IV SOLUTION) ×3 IMPLANT
PACK C SECTION AR (MISCELLANEOUS) ×3 IMPLANT
PAD DRESSING TELFA 3X8 NADH (GAUZE/BANDAGES/DRESSINGS) ×1 IMPLANT
PAD OB MATERNITY 4.3X12.25 (PERSONAL CARE ITEMS) ×6 IMPLANT
PAD PREP 24X41 OB/GYN DISP (PERSONAL CARE ITEMS) ×3 IMPLANT
PENCIL SMOKE ULTRAEVAC 22 CON (MISCELLANEOUS) ×3 IMPLANT
STRIP CLOSURE SKIN 1/2X4 (GAUZE/BANDAGES/DRESSINGS) ×2 IMPLANT
SUT MNCRL 4-0 (SUTURE) ×2
SUT MNCRL 4-0 27XMFL (SUTURE) ×1
SUT PDS AB 1 TP1 96 (SUTURE) ×3 IMPLANT
SUT PLAIN GUT 0 (SUTURE) IMPLANT
SUT VIC AB 0 CTX 36 (SUTURE) ×4
SUT VIC AB 0 CTX36XBRD ANBCTRL (SUTURE) ×2 IMPLANT
SUT VIC AB 3-0 SH 27 (SUTURE) ×2
SUT VIC AB 3-0 SH 27X BRD (SUTURE) IMPLANT
SUTURE MNCRL 4-0 27XMF (SUTURE) ×1 IMPLANT
SWABSTK COMLB BENZOIN TINCTURE (MISCELLANEOUS) ×3 IMPLANT

## 2018-10-02 NOTE — Op Note (Signed)
Cesarean Section Operative Note    Michelle Nelson   10/02/2018   Pre-operative Diagnosis:  1) History of cesarean section, desires repeat 2) intrauterine pregnancy at [redacted]w[redacted]d   Post-operative Diagnosis:  1) History of cesarean section, desires repeat 2) intrauterine pregnancy at [redacted]w[redacted]d    Procedure: Repeat Low Transverse Cesarean Section via Pfannenstiel incision with double-layer uterine closure  Surgeon: Surgeon(s) and Role:    Will Bonnet, MD - Primary  Assistants: Dr. Teresa Coombs; No other capable assistant available, in surgery requiring high level assistant.  Anesthesia: spinal   Findings:  1) normal appearing gravid uterus, fallopian tubes, and ovaries 2) viable female infant with weight 3,290 grams and APGARs 9 and 9   Estimated Blood Loss: 500 mL, Quantified blood loss pending  Total IV Fluids: 1,500 ml   Urine Output: 150 mL clear urine at end of case  Specimens: none  Complications: no complications  Disposition: PACU - hemodynamically stable.   Maternal Condition: stable   Baby condition / location:  Couplet care / Skin to Skin  Procedure Details:  The patient was seen in the Holding Room. The risks, benefits, complications, treatment options, and expected outcomes were discussed with the patient. The patient concurred with the proposed plan, giving informed consent. identified as Michelle Nelson and the procedure verified as C-Section Delivery. A Time Out was held and the above information confirmed.   After induction of anesthesia, the patient was draped and prepped in the usual sterile manner. A Pfannenstiel incision was made and carried down through the subcutaneous tissue to the fascia. Fascial incision was made and extended transversely. The fascia was separated from the underlying rectus tissue superiorly and inferiorly. The peritoneum was identified and entered. Peritoneal incision was extended longitudinally. The bladder flap was bluntly and sharply  freed from the lower uterine segment. A low transverse uterine incision was made and the hysterotomy was extended with cranial-caudal tension. Delivered from cephalic presentation was a 32,90 gram Living newborn infant(s) or Female with Apgar scores of 9 at one minute and 9 at five minutes. Cord ph was not sent the umbilical cord was clamped and cut cord blood was obtained for evaluation. The placenta was removed Intact and appeared normal. The uterine outline, tubes and ovaries appeared normal. The uterine incision was closed with running locked sutures of 0 Vicryl.  A second layer of the same suture was thrown in an imbricating fashion.  Hemostasis was assured.  The uterus was returned to the abdomen and the paracolic gutters were cleared of all clots and debris.  The rectus muscles were inspected and found to be hemostatic.  The On-Q catheter pumps were inserted in accordance with the manufacturer's recommendations.  The catheters were inserted approximately 4cm cephelad to the incision line, approximately 1cm apart, straddling the midline.  They were inserted to a depth of the 4th mark. They were positioned superficial to the rectus abdominus muscles and deep to the rectus fascia.    The fascia was then reapproximated with running sutures of 1-0 PDS, looped. A subcutaneous closure was performed with 3-0 vicryl stitch along the length of the incision.  The subcuticular closure was performed using 4-0 monocryl. The skin closure was reinforced using surgical skin glue.  The On-Q catheters were bolused with 5 mL of 0.5% marcaine plain for a total of 10 mL.  The catheters were affixed to the skin with surgical skin glue, steri-strips, and tegaderm.    Instrument, sponge, and needle counts were correct prior  the abdominal closure and were correct at the conclusion of the case.  The patient received Ancef 2 gram IV prior to skin incision (within 30 minutes). For VTE prophylaxis she was wearing SCDs throughout the  case.  The assistant surgeon was an MD due to lack of availability of another Sales promotion account executivequalified assistant.   Signed: Conard NovakStephen D. Ayanah Snader, MD 10/02/2018 9:03 AM

## 2018-10-02 NOTE — Transfer of Care (Signed)
Immediate Anesthesia Transfer of Care Note  Patient: Michelle Nelson  Procedure(s) Performed: CESAREAN SECTION (N/A )  Patient Location: PACU  Anesthesia Type:spinal  Level of Consciousness: awake, alert, and oriented  Airway & Oxygen Therapy: Patient Spontanous Breathing  Post-op Assessment: Report given to RN and Post -op Vital signs reviewed and stable  Post vital signs: Reviewed and stable  Last Vitals:  Vitals Value Taken Time  BP 114/54 10/02/18 0913  Temp 36.4 C 10/02/18 0913  Pulse 87 10/02/18 0913  Resp 21 10/02/18 0913  SpO2 97 % 10/02/18 0913    Last Pain:  Vitals:   10/02/18 0913  TempSrc:   PainSc: 0-No pain      Patients Stated Pain Goal: 2 (16/10/96 0454)  Complications: No apparent anesthesia complications

## 2018-10-02 NOTE — Interval H&P Note (Signed)
History and Physical Interval Note:  10/02/2018 7:34 AM  Michelle Nelson  has presented today for surgery, with the diagnosis of REPEAT CESAREAN.  The various methods of treatment have been discussed with the patient and family. After consideration of risks, benefits and other options for treatment, the patient has consented to  Procedure(s): CESAREAN SECTION (N/A) as a surgical intervention.  The patient's history has been reviewed, patient examined, no change in status, stable for surgery.  I have reviewed the patient's chart and labs.  Questions were answered to the patient's satisfaction.    Prentice Docker, MD, [facility], [facility] 10/02/2018 7:35 AM

## 2018-10-02 NOTE — Anesthesia Preprocedure Evaluation (Signed)
Anesthesia Evaluation  Patient identified by MRN, date of birth, ID band Patient awake    Reviewed: Allergy & Precautions, NPO status , Patient's Chart, lab work & pertinent test results  History of Anesthesia Complications (+) PONV and history of anesthetic complications  Airway Mallampati: II  TM Distance: >3 FB Neck ROM: Full    Dental no notable dental hx.    Pulmonary neg sleep apnea, neg COPD, Current Smoker,    breath sounds clear to auscultation- rhonchi (-) wheezing      Cardiovascular Exercise Tolerance: Good (-) hypertension(-) CAD, (-) Past MI, (-) Cardiac Stents and (-) CABG  Rhythm:Regular Rate:Normal - Systolic murmurs and - Diastolic murmurs    Neuro/Psych  Headaches, neg Seizures Anxiety    GI/Hepatic Neg liver ROS, GERD  ,  Endo/Other  negative endocrine ROSneg diabetes  Renal/GU negative Renal ROS     Musculoskeletal negative musculoskeletal ROS (+)   Abdominal (+) + obese, Gravid abdomen  Peds  Hematology negative hematology ROS (+)   Anesthesia Other Findings Past Medical History: No date: Anxiety     Comment:  no meds No date: Complication of anesthesia No date: GERD (gastroesophageal reflux disease) No date: Headache     Comment:  migraines No date: Medical history non-contributory No date: Miscarriage No date: PONV (postoperative nausea and vomiting)     Comment:  vomited during previous c/s   Reproductive/Obstetrics (+) Pregnancy                             Lab Results  Component Value Date   WBC 24.3 (H) 10/02/2018   HGB 10.9 (L) 10/02/2018   HCT 31.5 (L) 10/02/2018   MCV 85.8 10/02/2018   PLT 302 10/02/2018    Anesthesia Physical Anesthesia Plan  ASA: II  Anesthesia Plan: Spinal   Post-op Pain Management:    Induction:   PONV Risk Score and Plan: 2 and Ondansetron  Airway Management Planned: Natural Airway  Additional Equipment:    Intra-op Plan:   Post-operative Plan:   Informed Consent: I have reviewed the patients History and Physical, chart, labs and discussed the procedure including the risks, benefits and alternatives for the proposed anesthesia with the patient or authorized representative who has indicated his/her understanding and acceptance.     Dental advisory given  Plan Discussed with: CRNA and Anesthesiologist  Anesthesia Plan Comments:         Anesthesia Quick Evaluation

## 2018-10-02 NOTE — Progress Notes (Signed)
Pt is here for an elective repeat c/s. Pt denies vaginal bleeding and gushes of fluid. Pt states positive fetal movement. Pt states some tightening discomfort in her belly on occasion. Initial FHT 145. Monitors applied and assessing.

## 2018-10-02 NOTE — Anesthesia Procedure Notes (Signed)
Spinal  Patient location during procedure: OR Start time: 10/02/2018 7:54 AM End time: 10/02/2018 8:00 AM Staffing Anesthesiologist: Emmie Niemann, MD Resident/CRNA: Justus Memory, CRNA Performed: resident/CRNA  Preanesthetic Checklist Completed: patient identified, site marked, surgical consent, pre-op evaluation, timeout performed, IV checked, risks and benefits discussed and monitors and equipment checked Spinal Block Patient position: sitting Prep: ChloraPrep Patient monitoring: heart rate, continuous pulse ox, blood pressure and cardiac monitor Approach: midline Location: L4-5 Injection technique: single-shot Needle Needle type: Introducer and Pencil-Tip  Needle gauge: 24 G Needle length: 9 cm Additional Notes Negative paresthesia. Negative blood return. Positive free-flowing CSF. Expiration date of kit checked and confirmed. Patient tolerated procedure well, without complications.

## 2018-10-02 NOTE — Anesthesia Post-op Follow-up Note (Signed)
Anesthesia QCDR form completed.        

## 2018-10-02 NOTE — Discharge Summary (Deleted)
OB Discharge Summary     Patient Name: Michelle Nelson DOB: 02-21-2001 MRN: [id number]  Date of admission: 10/02/2018 Delivering MD: Prentice Docker, MD  Date of Delivery: 10/02/2018  Date of discharge: 10/02/2018  Admitting diagnosis: REPEAT CESAREAN Intrauterine pregnancy: [redacted]w[redacted]d     Secondary diagnosis: None     Discharge diagnosis: Term Pregnancy Delivered                                                                                                Post partum procedures:none  Augmentation: n/a  Complications: None  Hospital course:  Sceduled C/S   18 y.o. yo G3P0111 at [redacted]w[redacted]d was admitted to the hospital 10/02/2018 for scheduled cesarean section with the following indication:Elective Repeat.  Membrane Rupture Time/Date: 8:18 AM ,10/02/2018   Patient delivered a Viable infant.10/02/2018  Details of operation can be found in separate operative note.  Pateint had an uncomplicated postpartum course.  She is ambulating, tolerating a regular diet, passing flatus, and urinating well. Patient is discharged home in stable condition on  10/02/18         Physical exam  Vitals:   10/02/18 0604 10/02/18 0606 10/02/18 0700 10/02/18 0913  BP:  129/81 117/70 (!) 114/54  Pulse:  (!) 104 88 87  Resp:  18 18 (!) 21  Temp:  98 F (36.7 C) 97.7 F (36.5 C) (!) 97.5 F (36.4 C)  TempSrc:  Oral Oral   SpO2:    97%  Weight: 86.4 kg     Height: 5\' 4"  (1.626 m)      General: alert, cooperative and no distress Lochia: appropriate Uterine Fundus: firm Incision: Healing well with no significant drainage DVT Evaluation: No evidence of DVT seen on physical exam.  Labs: Lab Results  Component Value Date   WBC 24.3 (H) 10/02/2018   HGB 10.9 (L) 10/02/2018   HCT 31.5 (L) 10/02/2018   MCV 85.8 10/02/2018   PLT 302 10/02/2018    Discharge instruction: per After Visit Summary.  Medications:  Allergies as of 10/04/2018   No Known Allergies     Medication List    TAKE these medications    calcium carbonate 500 MG chewable tablet Commonly known as: TUMS - dosed in mg elemental calcium Chew 1 tablet by mouth 2 (two) times daily as needed for indigestion or heartburn.   ibuprofen 600 MG tablet Commonly known as: ADVIL Take 1 tablet (600 mg total) by mouth every 6 (six) hours.   oxyCODONE-acetaminophen 5-325 MG tablet Commonly known as: PERCOCET/ROXICET Take 1 tablet by mouth every 4 (four) hours as needed for moderate pain.   prenatal multivitamin Tabs tablet Take 1 tablet by mouth daily at 12 noon.            Discharge Care Instructions  (From admission, onward)         Start     Ordered   10/04/18 0000  Discharge wound care:    Comments: You may apply a light dressing for minor discharge from the incision or to keep waistbands of clothing from rubbing.  You may also have been discharge with  a clear dressing in which case this will be removed at your postoperative clinic visit.  You may shower, use soap on your incision.  Avoid baths or soaking the incision in the first 6 weeks following your surgery.Marland Kitchen.   10/04/18 1302           Diet: routine diet  Activity: Advance as tolerated. Pelvic rest for 6 weeks.   Outpatient follow up:     Postpartum contraception: Not Discussed Rhogam Given postpartum: no Rubella vaccine given postpartum: no Varicella vaccine given postpartum: no TDaP given antepartum or postpartum: 08/01/2018  Newborn Data: Live born female  Birth Weight: 7 lb 4.1 oz (3290 g) APGAR: 9, 9  Newborn Delivery   Birth date/time: 10/02/2018 08:19:00 Delivery type: C-Section, Low Transverse Trial of labor: No C-section categorization: Repeat       Baby Feeding: Breast  Disposition:home with mother  SIGNED:  Vena AustriaAndreas Nathyn Luiz, MD, Merlinda FrederickFACOG [facility], [facility] 10/04/2018, 1:06 PM

## 2018-10-02 NOTE — Consult Note (Signed)
Neonatology Note:   Attendance at C-section:    I was asked by Dr. Glennon Mac to attend this repeat C/S at term. The mother is a G3P1A1 O pos, GBS neg with late [facility], 1/4 pack/day cigarette smoker, and history of marijuana use. ROM at delivery, fluid clear. Infant vigorous with good spontaneous cry and tone. Delayed cord clamping was done. Needed no suctioning. Ap 9/9. Lungs clear to ausc in DR. Infant is able to remain with his mother for skin to skin time under nursing supervision. Transferred to the care of Pediatrician.   Real Cons, MD

## 2018-10-02 NOTE — Lactation Note (Signed)
This note was copied from a baby's chart. Lactation Consultation Note  Patient Name: Michelle Nelson WRUEA'V Date: 10/02/2018 Reason for consult: Initial assessment;1st time breastfeeding   Maternal Data Formula Feeding for Exclusion: No Has patient been taught Hand Expression?: Yes Does the patient have breastfeeding experience prior to this delivery?: No  Feeding Feeding Type: Breast Fed  LATCH Score Latch: Repeated attempts needed to sustain latch, nipple held in mouth throughout feeding, stimulation needed to elicit sucking reflex.  Audible Swallowing: A few with stimulation  Type of Nipple: Everted at rest and after stimulation  Comfort (Breast/Nipple): Soft / non-tender  Hold (Positioning): Assistance needed to correctly position infant at breast and maintain latch.  LATCH Score: 7  Interventions Interventions: Breast feeding basics reviewed;Assisted with latch;Skin to skin;Hand express  Lactation Tools Discussed/Used Pump Review: Setup, frequency, and cleaning;Milk Storage Initiated by:: Ramonita Lab Date initiated:: 10/02/18   Consult Status Consult Status: Follow-up Date: 10/02/18 Follow-up type: In-patient  LCs were called to assist MOB with setting up breast pump because MOB originally desired to pump and bottlefeed baby breast milk. When LCs inquired more, they found out that parents' first baby was immediately in NICU for 85mos and MOB pumped and bottlefeed breastmilk to baby for 79mos.  LCs then explained that it is easier to put a baby to the breast as opposed to pumping q2/3hrs especially with a 2yo at home. LCs asked MOB if she would be ok with attempting to put baby to breast and FOB spoke up and encouraged MOB to do and stated that his mother told MOB that baby's bite when breastfeeding and that it would hurt. LCs informed MOB of latching info and reassured that putting baby to breast should not hurt or be painful.   MOB expressed interest in  putting baby on her breast for a feeding while doing skin to skin. Baby successfully latched and MOB stated that it did not hurt. Baby was nursing well for approx. 73mins before LCs left room.  LCs will go back to access how the remainder of the feeding went later in the morning.  Marnee Spring 10/02/2018, 10:45 AM

## 2018-10-03 LAB — CBC
HCT: 26.5 % — ABNORMAL LOW (ref 36.0–46.0)
Hemoglobin: 8.9 g/dL — ABNORMAL LOW (ref 12.0–15.0)
MCH: 29.5 pg (ref 26.0–34.0)
MCHC: 33.6 g/dL (ref 30.0–36.0)
MCV: 87.7 fL (ref 80.0–100.0)
Platelets: 232 10*3/uL (ref 150–400)
RBC: 3.02 MIL/uL — ABNORMAL LOW (ref 3.87–5.11)
RDW: 13.1 % (ref 11.5–15.5)
WBC: 19.6 10*3/uL — ABNORMAL HIGH (ref 4.0–10.5)
nRBC: 0 % (ref 0.0–0.2)

## 2018-10-03 LAB — HEPATITIS B SURFACE ANTIGEN: Hepatitis B Surface Ag: NEGATIVE

## 2018-10-03 LAB — RPR: RPR Ser Ql: NONREACTIVE

## 2018-10-03 MED ORDER — OXYCODONE HCL 5 MG PO TABS: 10.0000 mg | ORAL_TABLET | ORAL | Status: DC | PRN

## 2018-10-03 MED ORDER — OXYCODONE HCL 5 MG PO TABS
5.0000 mg | ORAL_TABLET | ORAL | Status: DC | PRN
  Administered 2018-10-03: 5 mg via ORAL
  Filled 2018-10-03: qty 1

## 2018-10-03 NOTE — Anesthesia Postprocedure Evaluation (Signed)
Anesthesia Post Note  Patient: Tenita Cue  Procedure(s) Performed: CESAREAN SECTION (N/A )  Patient location during evaluation: Mother Baby Anesthesia Type: Spinal Level of consciousness: awake, oriented and awake and alert Pain management: pain level controlled Vital Signs Assessment: post-procedure vital signs reviewed and stable Respiratory status: spontaneous breathing and respiratory function stable Cardiovascular status: blood pressure returned to baseline and stable Postop Assessment: no headache Anesthetic complications: no Comments: Has walked around floor, has voided.  Looks great     Last Vitals:  Vitals:   10/03/18 0300 10/03/18 0445  BP:    Pulse:    Resp:    Temp:    SpO2: 99% 100%    Last Pain:  Vitals:   10/03/18 0630  TempSrc:   PainSc: 7                  Buckner Malta

## 2018-10-03 NOTE — Lactation Note (Signed)
This note was copied from a baby's chart. Lactation Consultation Note  Patient Name: Michelle Nelson RNHAF'B Date: 10/03/2018     Maternal Data    Feeding    LATCH Score                   Interventions    Lactation Tools Discussed/Used     Consult Status    LC spoke with mom this morning. Mom continues to pump every 2-3hours, reporting 2-46ml output. Baby is given all expressed milk, and parents have started to give formula, last feed 59mL. Mom reports nipples are sore during and after pumping. Provided comfort gels, and coconut oil, and size 22mm breast shields. Mom plans to continue only pumping, with no desire to put baby back to the breast. Pumping is what she is most comfortable with, after pumping for several months with last child.  Gave information for breastfeeding support group available after discharge. Encouraged to feed with infant's hunger cues. Encouraged to call out for [facility] support, name/phone number written on white board.  Lavonia Drafts 10/03/2018, 10:44 AM

## 2018-10-03 NOTE — Progress Notes (Signed)
POD #1 Repeat Cesarean section Subjective:   Doing well. Tolerating regular diet. Voiding without difficulty. Denies lightheadedness when OOB. Starting to pass flatus.Ambulating in room without assistance  Objective:  Blood pressure 111/72, pulse 77, temperature 97.9 F (36.6 C), temperature source Axillary, resp. rate 20, height 5\' 4"  (1.626 m), weight 86.4 kg, last menstrual period 01/01/2018, SpO2 99 %, unknown if currently breastfeeding.  General: WF in NAD Heart: RRR without murmur Pulmonary: no increased work of breathing/ CTAB Abdomen: non-distended, appropriately tender, fundus firm at level of umbilicus-1, bowel sounds hypoactive Incision:Honeycomb dressing C+D+I, ON Q intact Extremities:  Trace ankle edema, no erythema, no tenderness  Results for orders placed or performed during the hospital encounter of 10/02/18 (from the past 72 hour(s))  CBC     Status: Abnormal   Collection Time: 10/02/18  6:10 AM  Result Value Ref Range   WBC 24.3 (H) 4.0 - 10.5 K/uL   RBC 3.67 (L) 3.87 - 5.11 MIL/uL   Hemoglobin 10.9 (L) 12.0 - 15.0 g/dL   HCT 13.231.5 (L) 44.036.0 - 10.246.0 %   MCV 85.8 80.0 - 100.0 fL   MCH 29.7 26.0 - 34.0 pg   MCHC 34.6 30.0 - 36.0 g/dL   RDW 72.513.0 36.611.5 - 44.015.5 %   Platelets 302 150 - 400 K/uL   nRBC 0.0 0.0 - 0.2 %    Comment: Performed at [facility], [address] [place], [place] [address]  RPR     Status: None   Collection Time: 10/02/18  6:10 AM  Result Value Ref Range   RPR Ser Ql Non Reactive Non Reactive    Comment: (NOTE) Performed At: [facility] [address] [place], [place] [id number] Jolene SchimkeNagendra Sanjai MD EP:[phone]   Rapid HIV screen (HIV 1/2 Ab+Ag)     Status: None   Collection Time: 10/02/18  6:10 AM  Result Value Ref Range   HIV-1 P24 Antigen - HIV24 NON REACTIVE NON REACTIVE   HIV 1/2 Antibodies NON REACTIVE NON REACTIVE   Interpretation (HIV Ag Ab)      A non reactive test result means that HIV 1 or HIV 2 antibodies and  HIV 1 p24 antigen were not detected in the specimen.    Comment: Performed at [facility], [address] [place], [place] [address]  Type and screen [facility]     Status: None   Collection Time: 10/02/18  6:10 AM  Result Value Ref Range   ABO/RH(D) O POS    Antibody Screen NEG    Sample Expiration      10/05/2018,2359 Performed at [facility], [address] [place], [place] [address]   Hepatitis B surface antigen     Status: None   Collection Time: 10/02/18  9:39 AM  Result Value Ref Range   Hepatitis B Surface Ag Negative Negative    Comment: (NOTE) Performed At: [facility] [address] [place], [place] [id number] Jolene SchimkeNagendra Sanjai MD KG:[phone]   CBC     Status: Abnormal   Collection Time: 10/03/18  5:24 AM  Result Value Ref Range   WBC 19.6 (H) 4.0 - 10.5 K/uL   RBC 3.02 (L) 3.87 - 5.11 MIL/uL   Hemoglobin 8.9 (L) 12.0 - 15.0 g/dL   HCT 62.826.5 (L) 31.536.0 - 17.646.0 %   MCV 87.7 80.0 - 100.0 fL   MCH 29.5 26.0 - 34.0 pg   MCHC 33.6 30.0 - 36.0 g/dL   RDW 16.013.1 73.711.5 -  15.5 %   Platelets 232 150 - 400 K/uL   nRBC 0.0 0.0 - 0.2 %    Comment: Performed at [facility], [place] [place], [place] [address]     Assessment:   18 y.o. [id number] postoperativeday # 1-stable  Continue postpartum/ postoperative care   Plan:  1) Acute blood loss anemia - hemodynamically stable and asymptomatic - po ferrous sulfate/ vitamins  2) O POS/ RI/ VI  3) TDAP given AP   4) Breast/Bottle/Contraception?  5) Disposition-discharge in AM  Dalia Heading, [place]

## 2018-10-03 NOTE — Clinical Social Work Maternal (Signed)
SW consult placed for transportation issues.  Patient delivered a baby boy yesterday.  She lives with her boyfriend, Annell Greening.  He is the FOB of their 20 year old girl.  Their family live in [place].  The 20 year old is with his mother in [place] and she will be bringing her back home after discharge.  They do not have a car.  They have Medicaid transportation for doctors appointments.  A friend brought them to the hospital and is unable to pick they them up as he works.  Provided Raquel Sarna, RN with taxi voucher for discharge.  Patient states they have all supplies at home prepared for infant.  She is bottle feeding and pumping.  No further needs identified.

## 2018-10-04 ENCOUNTER — Ambulatory Visit: Payer: Medicaid Other | Admitting: Obstetrics and Gynecology

## 2018-10-04 MED ORDER — OXYCODONE-ACETAMINOPHEN 5-325 MG PO TABS: 1.0000 | ORAL_TABLET | ORAL | 0 refills | Status: AC | PRN

## 2018-10-04 MED ORDER — IBUPROFEN 600 MG PO TABS: 600.0000 mg | ORAL_TABLET | Freq: Four times a day (QID) | ORAL | 0 refills | Status: AC

## 2018-10-04 NOTE — Lactation Note (Signed)
This note was copied from a baby's chart. Lactation Consultation Note  Patient Name: Michelle Nelson Date: 10/04/2018     Maternal Data    Feeding Feeding Type: Bottle Fed - Formula Nipple Type: Regular  LATCH Score                   Interventions    Lactation Tools Discussed/Used     Consult Status  Mother states that she is consistently pumping and is getting out more milk each time. She also states that using the coconut oil helps with decreasing the pain while pumping. Mother is currently using the size 27 mm flanges and is working well so far. She has a size 30 mm just in case she needs them once her nipple size grows. Mother has a medela pump-in-style advanced at home.     [name] 9/35/7017, 12:08 PM

## 2018-10-04 NOTE — Progress Notes (Signed)
Pt discharged with infant.  Discharge instructions, prescriptions and follow up appointment given to and reviewed with pt. Pt verbalized understanding. Escorted out by auxillary. 

## 2018-10-05 NOTE — Discharge Summary (Signed)
.   Obstetric Discharge Summary Reason for Admission: cesarean section Prenatal Procedures: none Intrapartum Procedures: cesarean: low cervical, transverse Postpartum Procedures: none Complications-Operative and Postpartum: none Hemoglobin  Date Value Ref Range Status  10/03/2018 8.9 (L) 12.0 - 15.0 g/dL Final  08/01/2018 11.2 11.1 - 15.9 g/dL Final   HCT  Date Value Ref Range Status  10/03/2018 26.5 (L) 36.0 - 46.0 % Final   Hematocrit  Date Value Ref Range Status  08/01/2018 33.3 (L) 34.0 - 46.6 % Final    Physical Exam:  General: alert, cooperative, appears stated age and no distress Lochia: appropriate Uterine Fundus: firm Incision: healing well DVT Evaluation: No evidence of DVT seen on physical exam.  Discharge Diagnoses: Term Pregnancy-delivered  Discharge Information: Date: 10/05/2018 Activity: pelvic rest Diet: routine Allergies as of 10/04/2018   No Known Allergies     Medication List    TAKE these medications   calcium carbonate 500 MG chewable tablet Commonly known as: TUMS - dosed in mg elemental calcium Chew 1 tablet by mouth 2 (two) times daily as needed for indigestion or heartburn.   ibuprofen 600 MG tablet Commonly known as: ADVIL Take 1 tablet (600 mg total) by mouth every 6 (six) hours.   oxyCODONE-acetaminophen 5-325 MG tablet Commonly known as: PERCOCET/ROXICET Take 1 tablet by mouth every 4 (four) hours as needed for moderate pain.   prenatal multivitamin Tabs tablet Take 1 tablet by mouth daily at 12 noon.            Discharge Care Instructions  (From admission, onward)         Start     Ordered   10/04/18 0000  Discharge wound care:    Comments: You may apply a light dressing for minor discharge from the incision or to keep waistbands of clothing from rubbing.  You may also have been discharge with a clear dressing in which case this will be removed at your postoperative clinic visit.  You may shower, use soap on your incision.   Avoid baths or soaking the incision in the first 6 weeks following your surgery.Marland Kitchen   10/04/18 1302          Condition: stable Discharge to: home Follow-up Information    Will Bonnet, MD Follow up on 10/10/2018.   Specialty: Obstetrics and Gynecology Why: postop 10:30am @ [facility] information: [address] [place] [address] [phone]           Newborn Data: Live born female  Birth Weight: 7 lb 4.1 oz (3290 g) APGAR: 47, 9  Newborn Delivery   Birth date/time: 10/02/2018 08:19:00 Delivery type: C-Section, Low Transverse Trial of labor: No C-section categorization: Repeat      Home with mother.  Michelle Nelson 10/05/2018, 12:34 AM

## 2018-10-10 ENCOUNTER — Ambulatory Visit: Payer: Medicaid Other | Admitting: Obstetrics and Gynecology

## 2018-10-19 ENCOUNTER — Other Ambulatory Visit: Payer: Self-pay

## 2018-10-19 ENCOUNTER — Ambulatory Visit (INDEPENDENT_AMBULATORY_CARE_PROVIDER_SITE_OTHER): Payer: Medicaid Other | Admitting: Obstetrics and Gynecology

## 2018-10-19 ENCOUNTER — Encounter: Payer: Self-pay | Admitting: Obstetrics and Gynecology

## 2018-10-19 VITALS — BP 122/80 | Ht 64.0 in | Wt 175.0 lb

## 2018-10-19 DIAGNOSIS — Z09 Encounter for follow-up examination after completed treatment for conditions other than malignant neoplasm: Secondary | ICD-10-CM

## 2018-10-19 NOTE — Progress Notes (Signed)
   Postoperative Follow-up Patient presents post op from cesarean section  2 weeks ago.  Subjective: She denies fever, chills, nausea and vomiting. Eating a regular diet without difficulty. The patient is not having any pain.  Activity: increasing slowly. She denies issues with her incision.    Objective: BP 122/80   Ht 5\' 4"  (1.626 m)   Wt 175 lb (79.4 kg)   BMI 30.04 kg/m   Constitutional: Well nourished, well developed female in no acute distress.  HEENT: normal Skin: Warm and dry.  Abdomen: soft, Nt, nd, +BS, fundus at U -3 clean, dry, intact and without erythema, induration, warmth, and tenderness Extremity: no edema   Assessment: 18 y.o. s/p cesarean section progressing well  Plan: Patient has done well after surgery with no apparent complications.  I have discussed the post-operative course to date, and the expected progress moving forward.  The patient understands what complications to be concerned about.    Activity plan: increase slowly  Return in about 4 weeks (around 11/16/2018) for Six Week Postpartum.  Prentice Docker, MD 10/19/2018 2:59 PM

## 2018-11-19 ENCOUNTER — Ambulatory Visit: Payer: Medicaid Other | Admitting: Obstetrics and Gynecology

## 2019-01-07 ENCOUNTER — Ambulatory Visit: Payer: Medicaid Other | Admitting: Obstetrics and Gynecology

## 2019-08-01 IMAGING — US US RENAL
1 series · 14 of 25 positions shown · non-contrast
Comparison: None.

CLINICAL DATA: Flank pain and hematuria

EXAM:
RENAL / URINARY TRACT ULTRASOUND COMPLETE

[Series 1: us renal · 0.23mm/px · 14 of 27 slices shown]
[im 1/27]
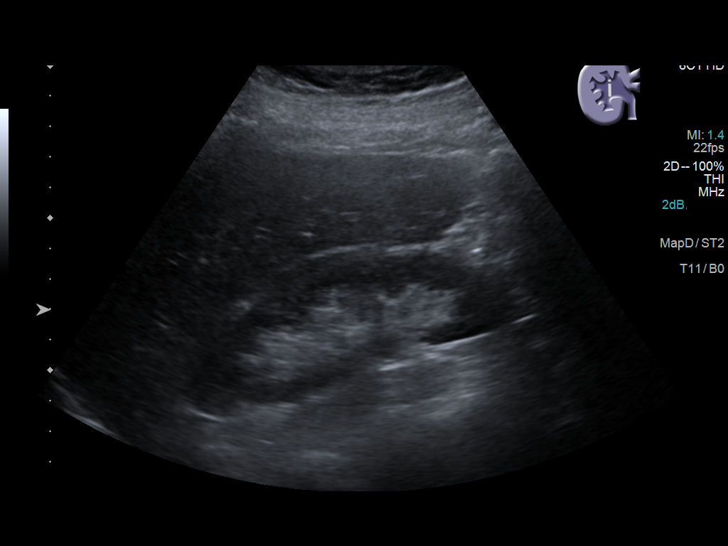
[im 3/27]
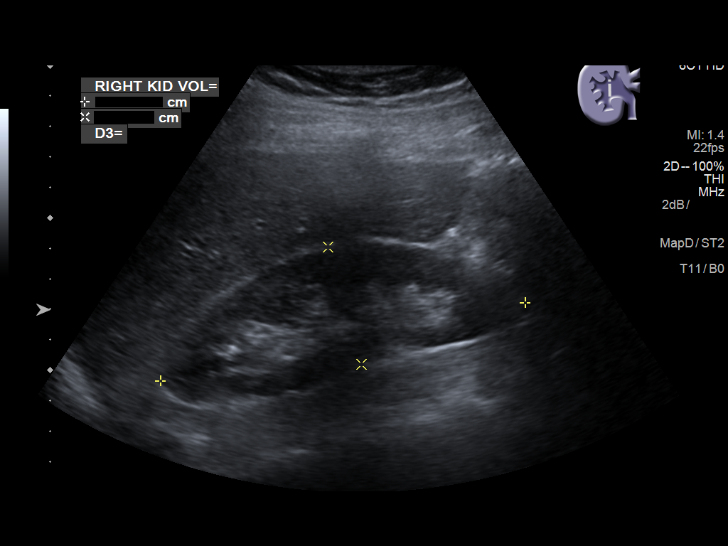
[im 5/27]
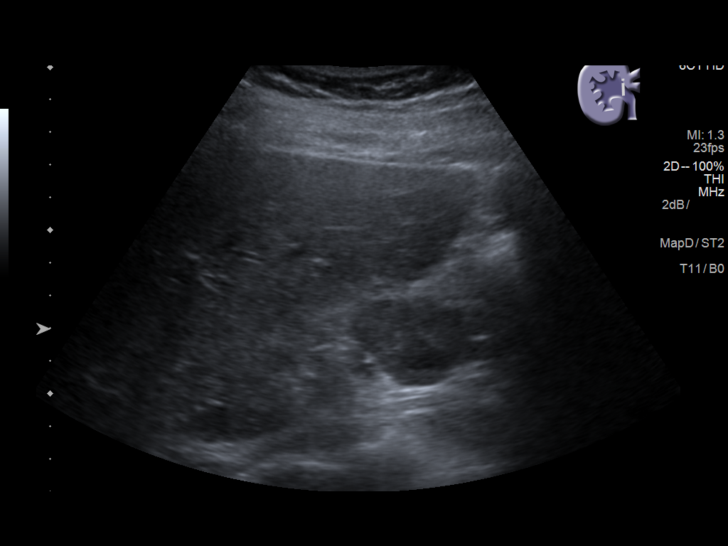
[im 7/27]
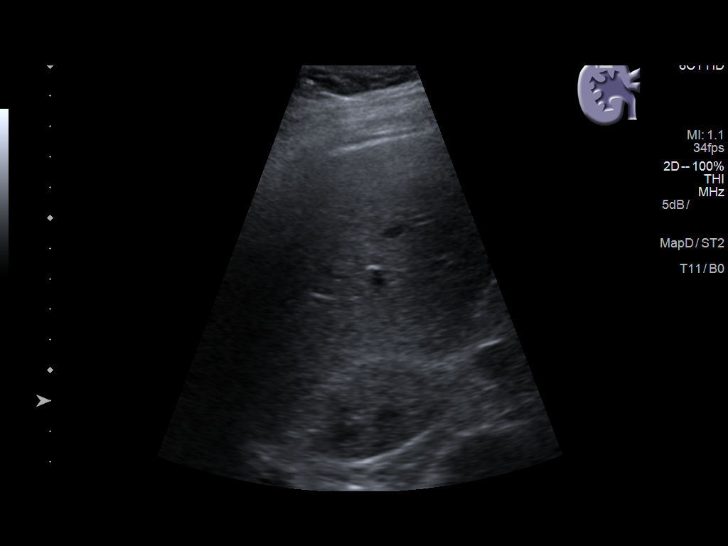
[im 9/27]
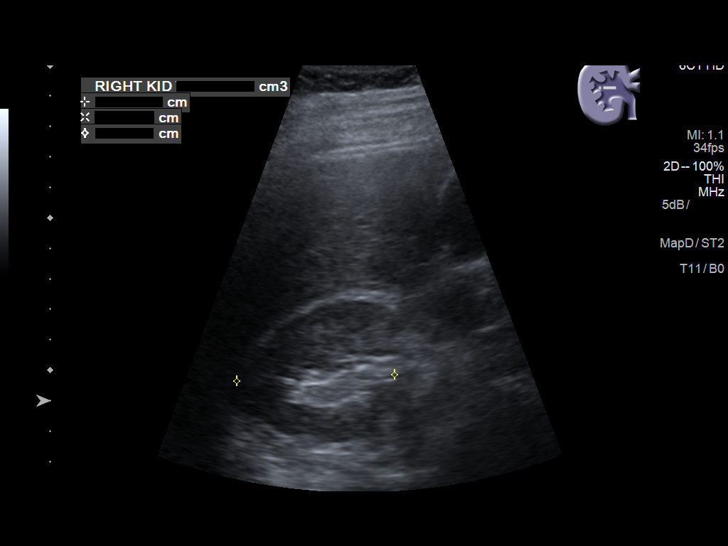
[im 10/27]
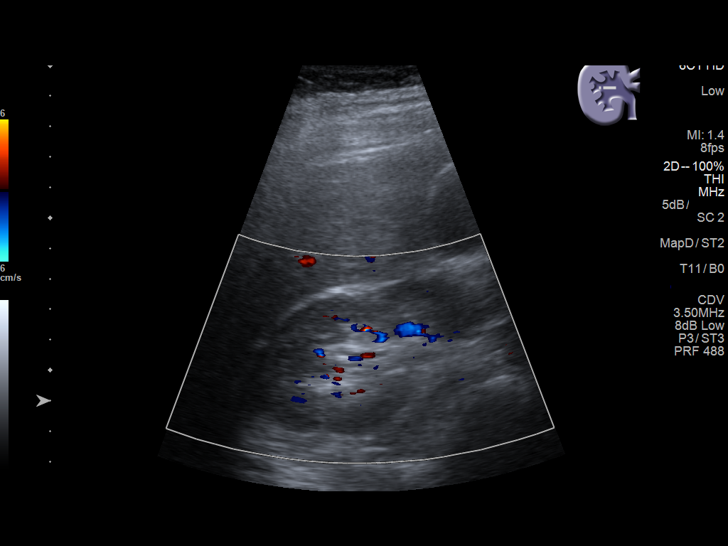
[im 12/27]
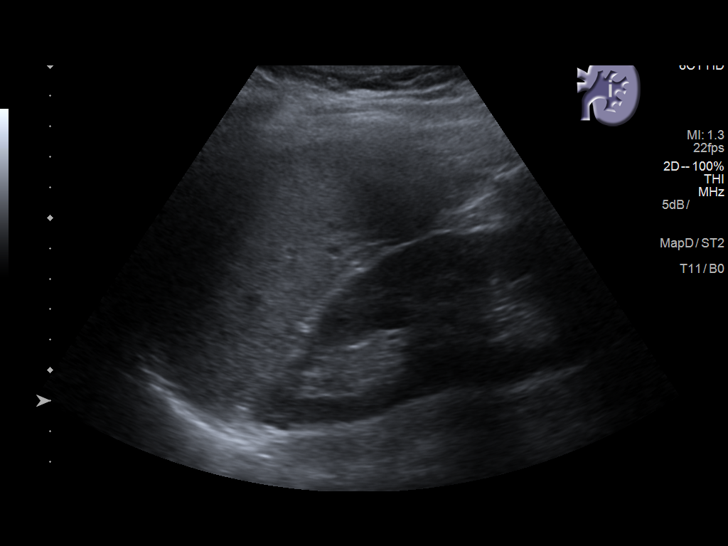
[im 15/27]
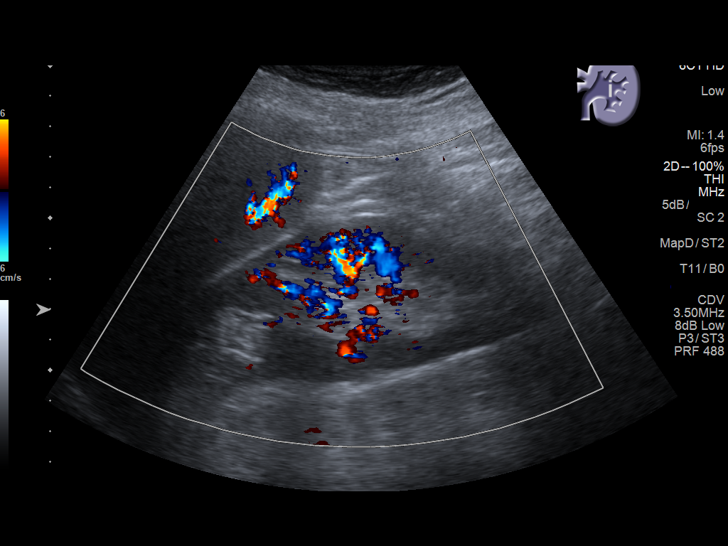
[im 17/27]
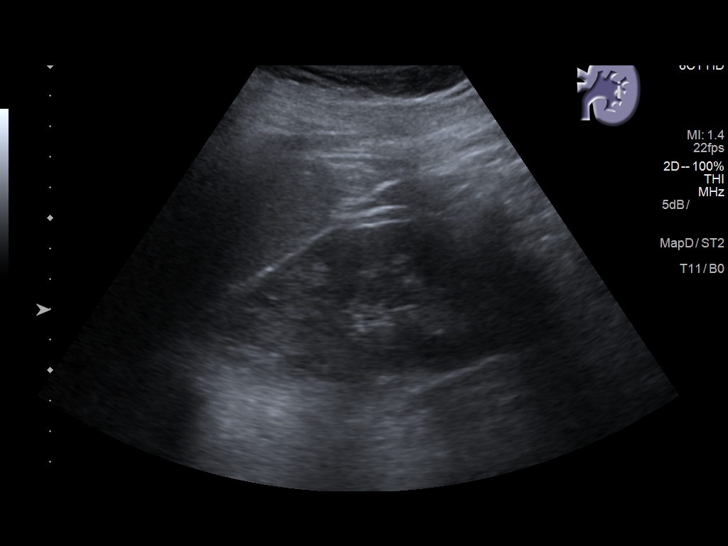
[im 18/27]
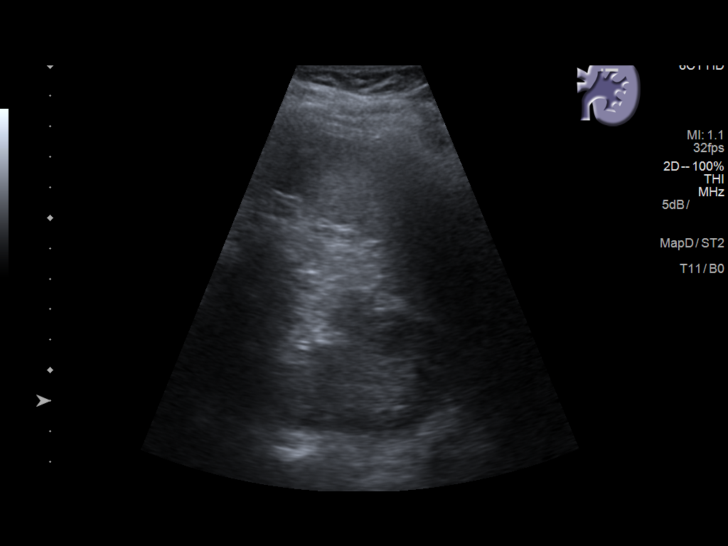
[im 20/27]
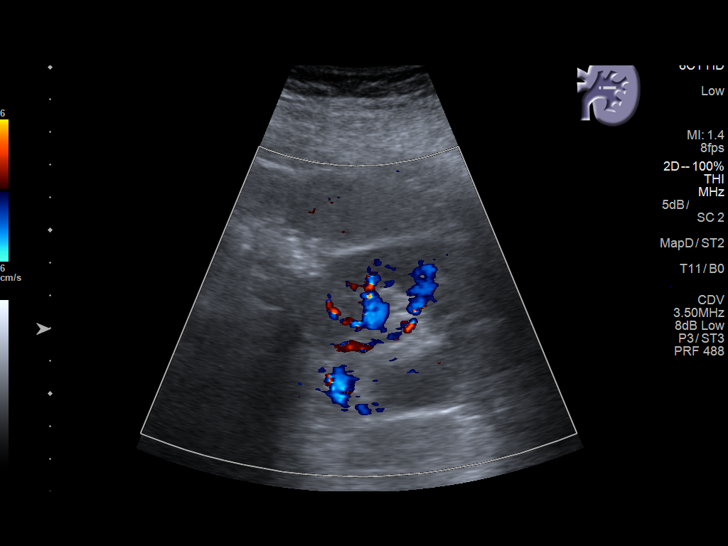
[im 22/27]
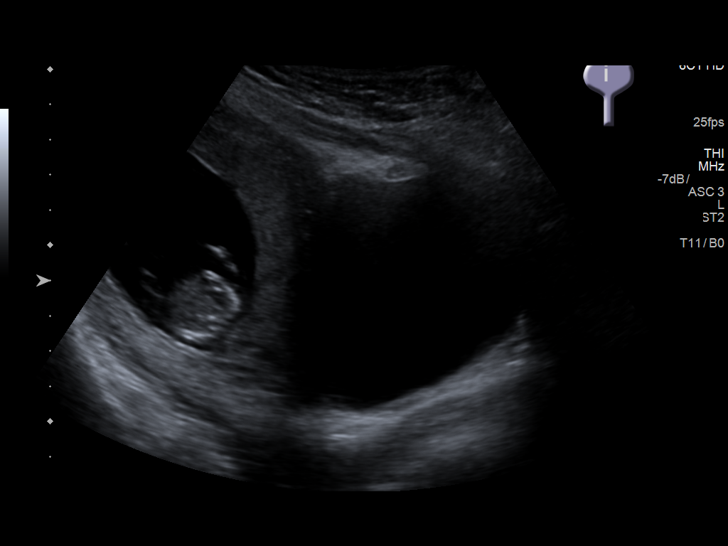
[im 24/27]
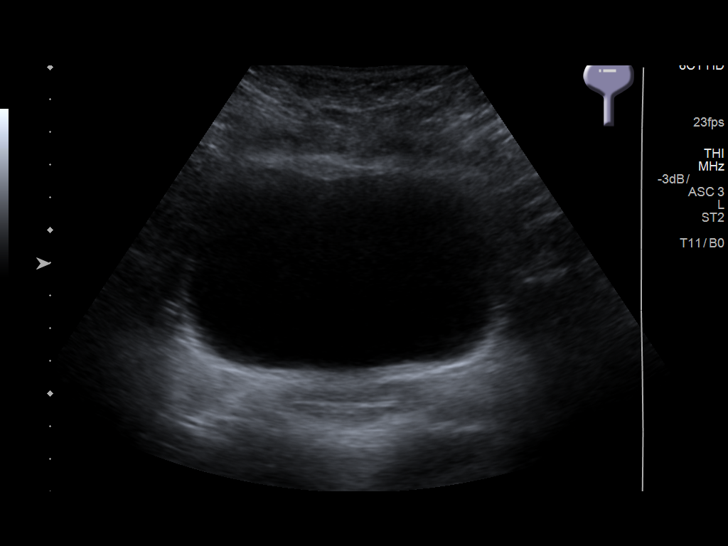
[im 27/27]
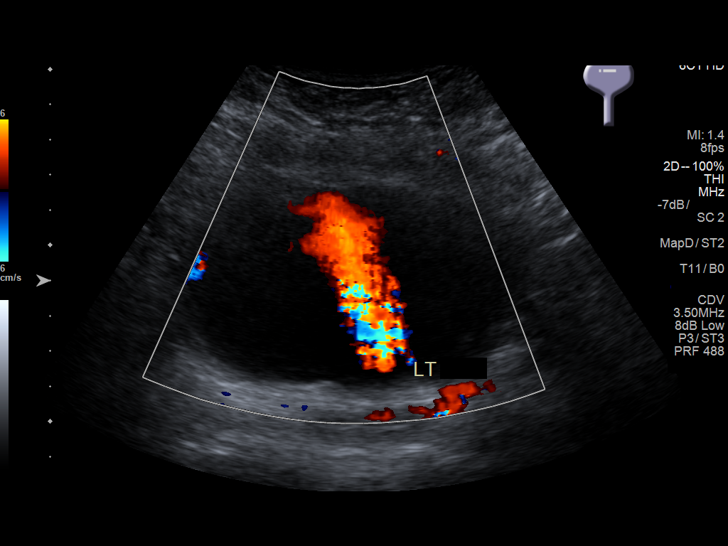

[14 of 25 positions shown; findings below may reference images not displayed]

FINDINGS: Right Kidney:

Renal measurements: 12.2 x 4.0 x 5.2 cm = volume: 133 mL .
Echogenicity within normal limits. No mass or hydronephrosis
visualized.

Left Kidney:

Renal measurements: 13.3 x 5.2 x 4.3 cm = volume: 157.2 mL.
Echogenicity within normal limits. No mass or hydronephrosis
visualized.

Bladder:

Appears normal for degree of bladder distention.
IMPRESSION: Normal renal sonogram.

## 2019-08-01 IMAGING — US US OB COMP LESS 14 WK
1 series · 14 of 28 positions shown · non-contrast
Comparison: Renal ultrasound today reported separately.

CLINICAL DATA: 17-year-old female with abdominal pain in the 1st
trimester of pregnancy. Quantitative beta HCG [DATE].

EXAM:
OBSTETRIC <14 WK ULTRASOUND
TECHNIQUE: Transabdominal ultrasound was performed for evaluation of the
gestation as well as the maternal uterus and adnexal regions.

[Series 1: us ob comp less 14 wk · 0.19mm/px · 14 of 37 slices shown]
[im 2/37]
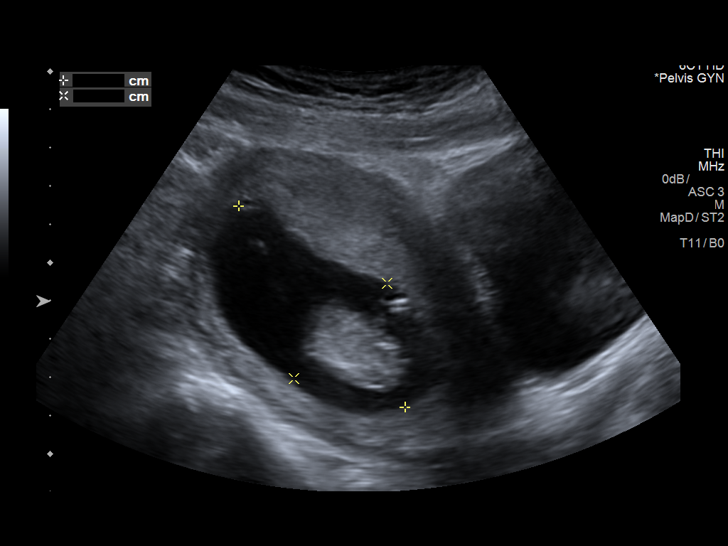
[im 5/37]
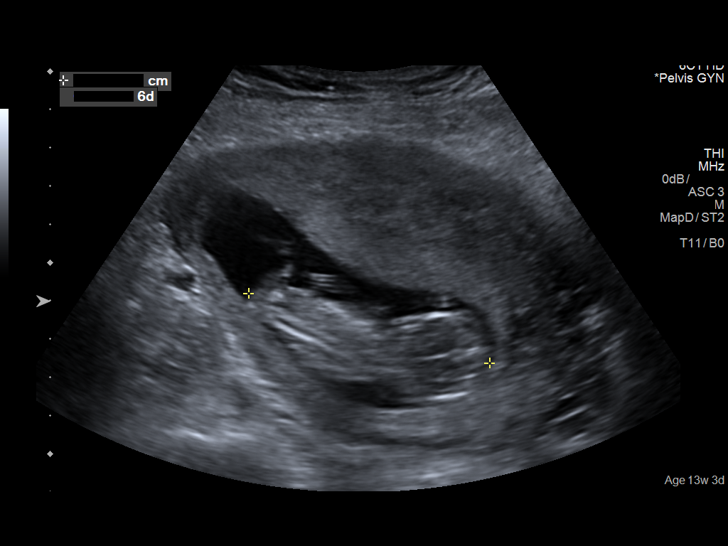
[im 7/37]
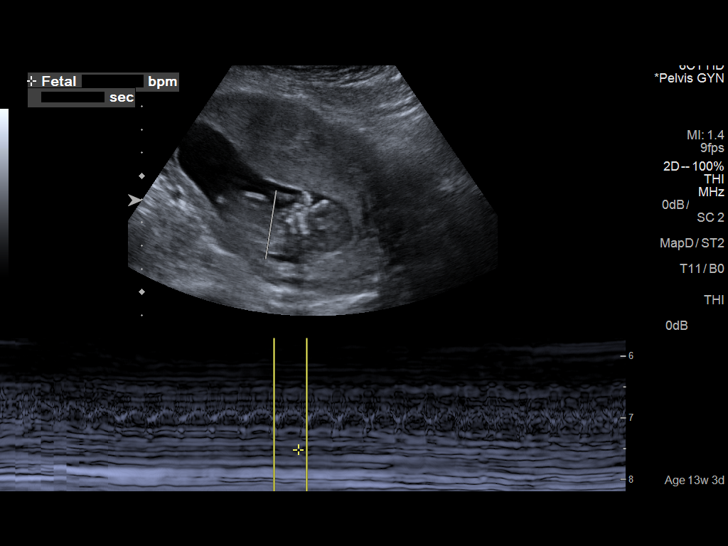
[im 10/37]
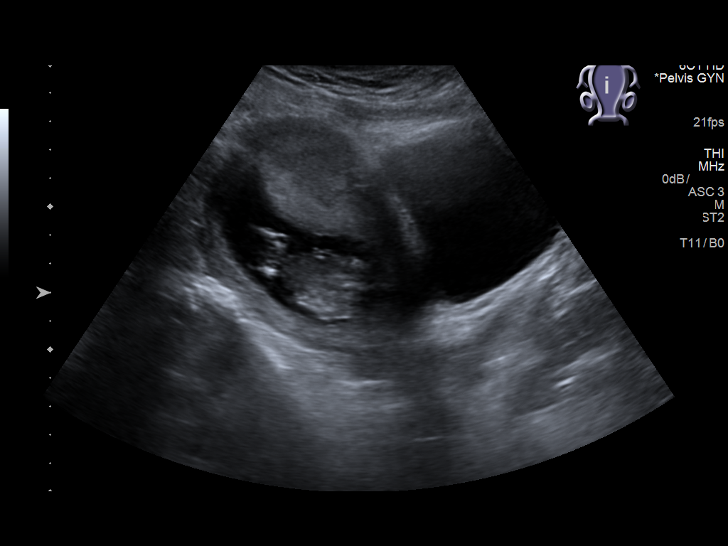
[im 13/37]
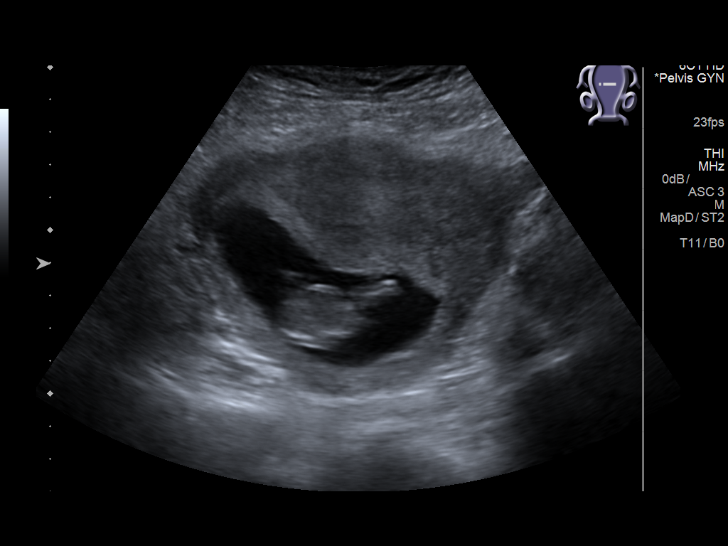
[im 15/37]
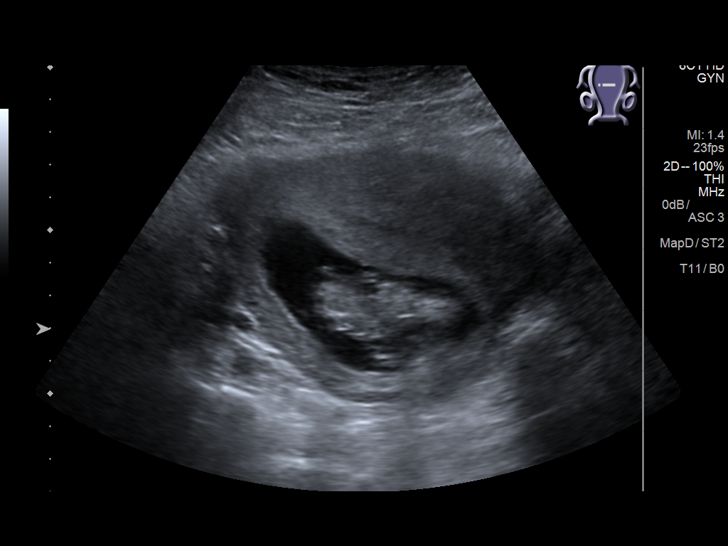
[im 18/37]
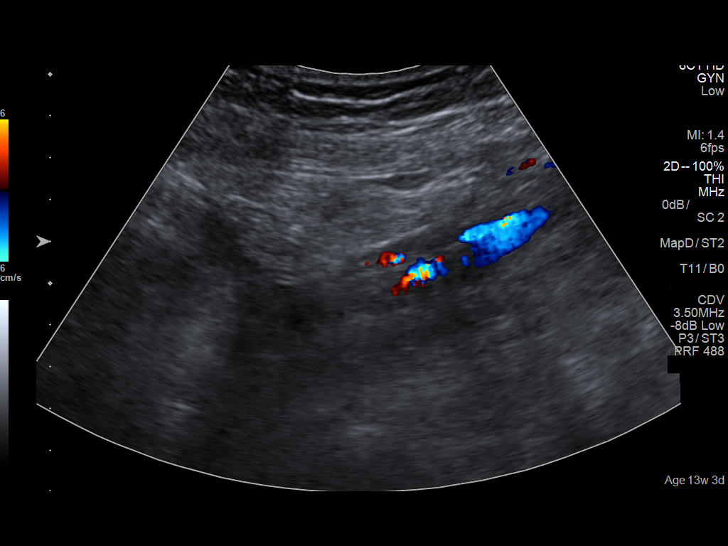
[im 21/37]
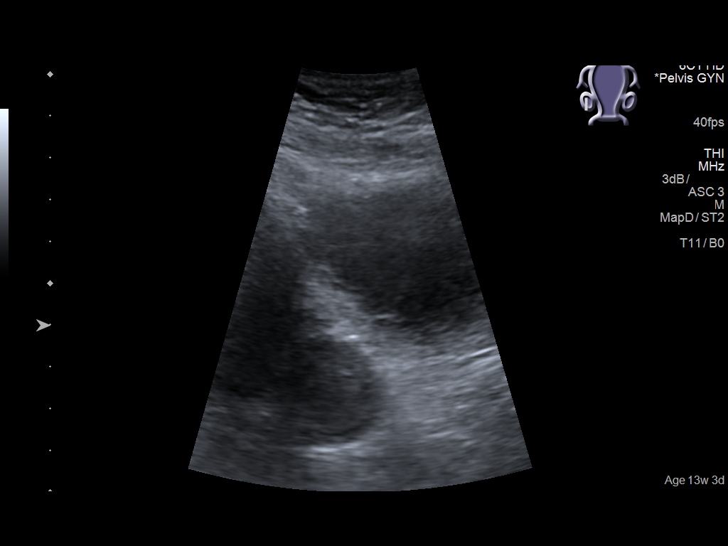
[im 23/37]
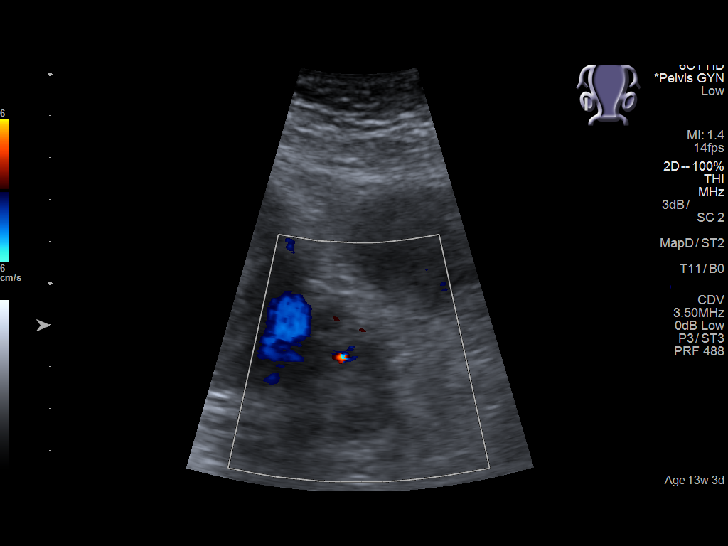
[im 26/37]
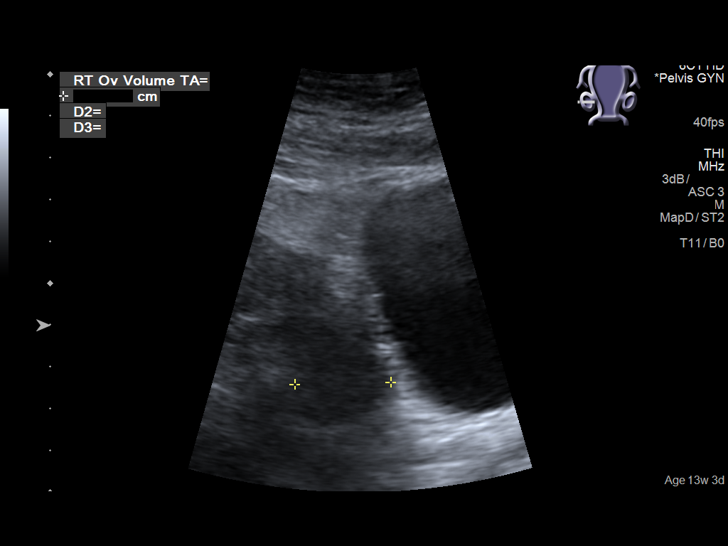
[im 29/37]
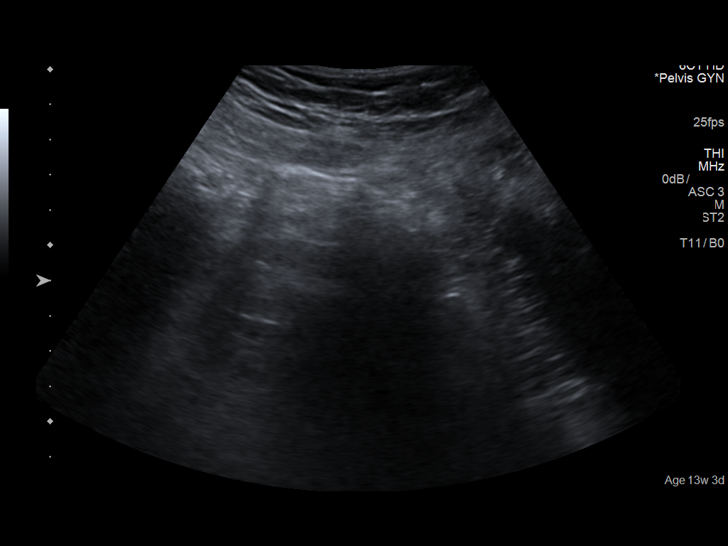
[im 31/37]
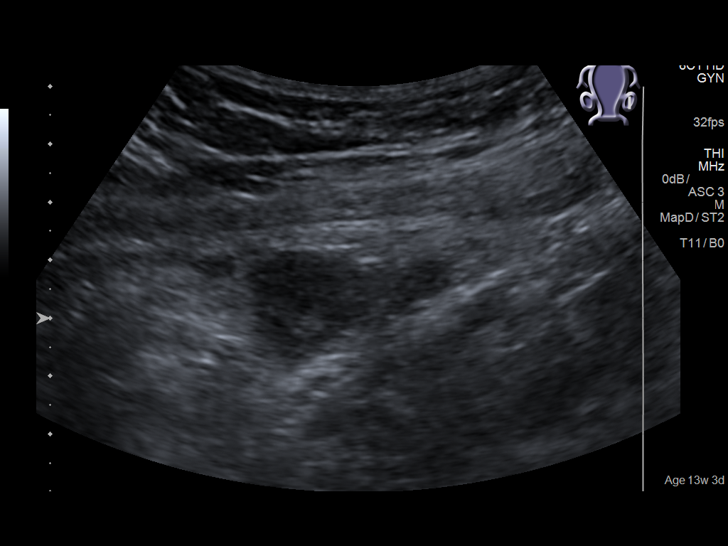
[im 34/37]
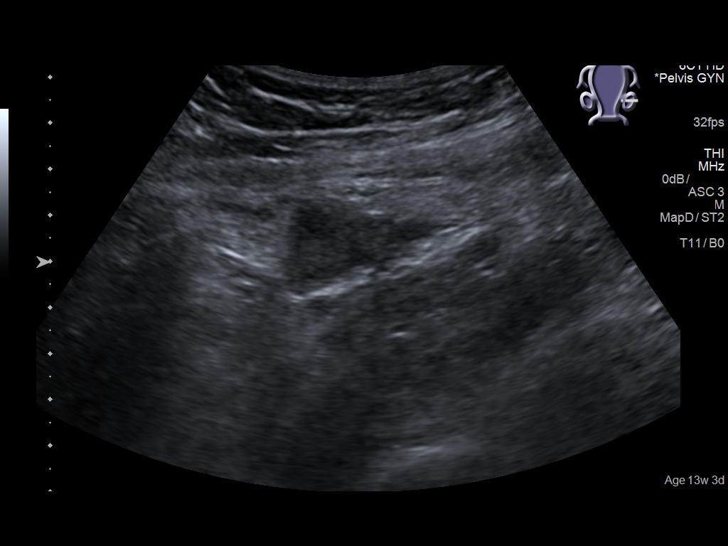
[im 37/37]
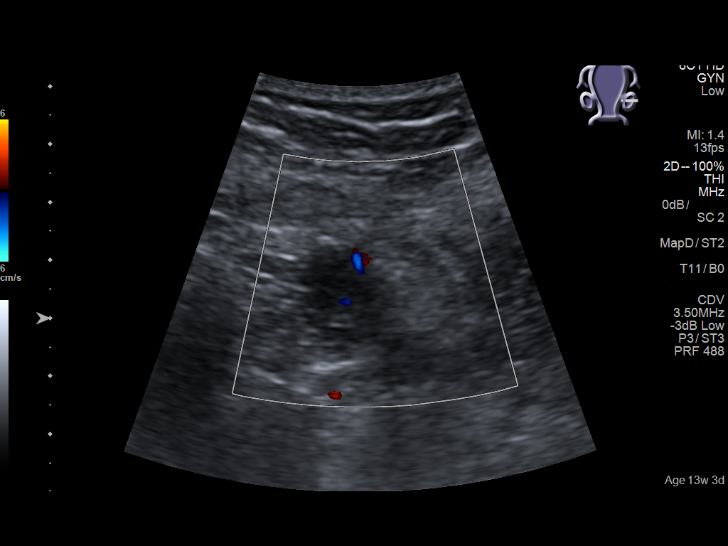

[14 of 28 positions shown; findings below may reference images not displayed]

FINDINGS: Intrauterine gestational sac: Single

Yolk sac:  Not visible

Embryo:  Visible

Cardiac Activity: Detected

Heart Rate: 157 bpm

CRL:   64 mm   12 w 5 d                  US EDC: 10/13/2018

Subchorionic hemorrhage:  None visualized.

Maternal uterus/adnexae:

The right ovary appears normal measuring 2.3 x 2.5 x
centimeters. The left ovary appears normal measuring 3.5 x 1.9 by
1.3 centimeters. No pelvic free fluid.
IMPRESSION: Single living IUP demonstrated with estimated gestational age of 12
weeks and 5 days.

No acute maternal findings.

## 2020-05-14 IMAGING — US OBSTETRIC 14+ WK ULTRASOUND
1 series · 13 of 28 positions shown · non-contrast
Comparison: none

CLINICAL DATA: Supervision of normal pregnancy. Anatomy evaluation.
Prior history of gastroschisis with first baby.

EXAM:
OBSTETRICAL ULTRASOUND >14 WKS AND TRANSVAGINAL OB US

[Series 1: obstetric 14+ wk ultrasound · 13 of 101 slices shown]
[im 4/101]
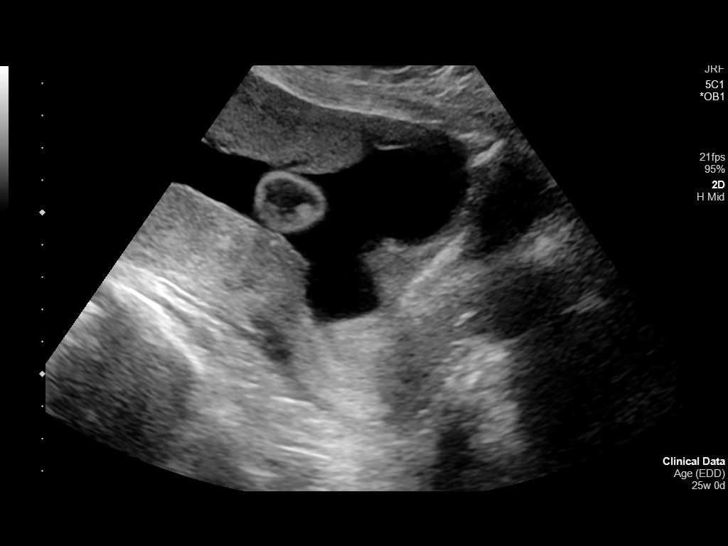
[im 12/101]
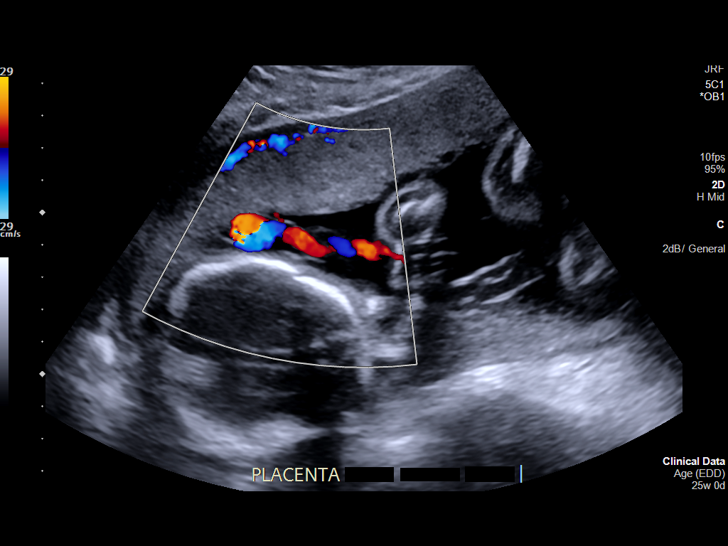
[im 19/101]
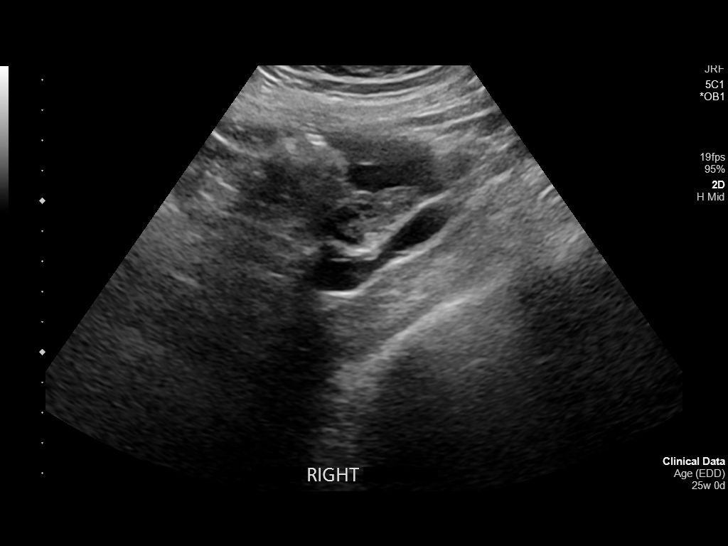
[im 26/101]
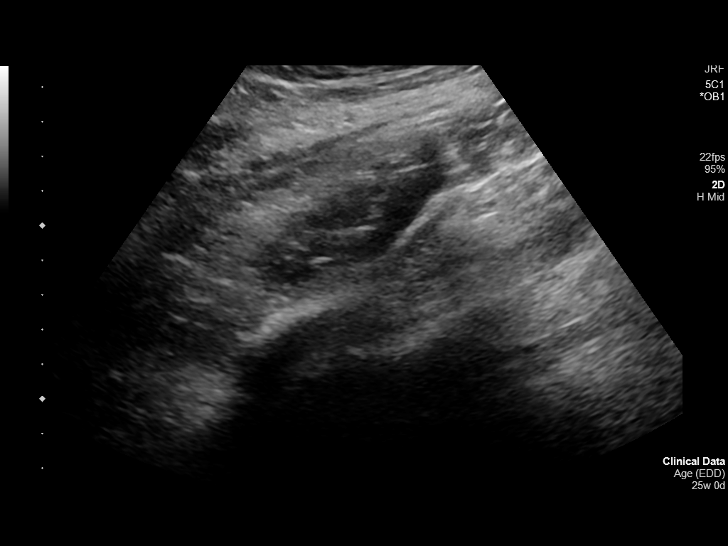
[im 34/101]
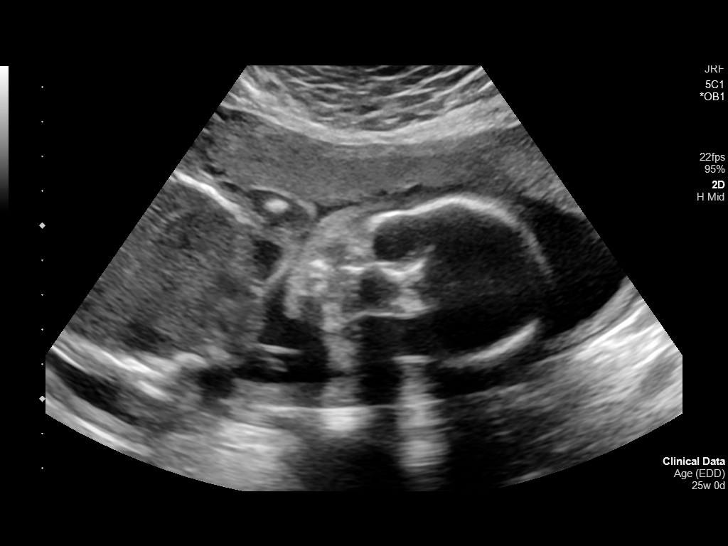
[im 41/101]
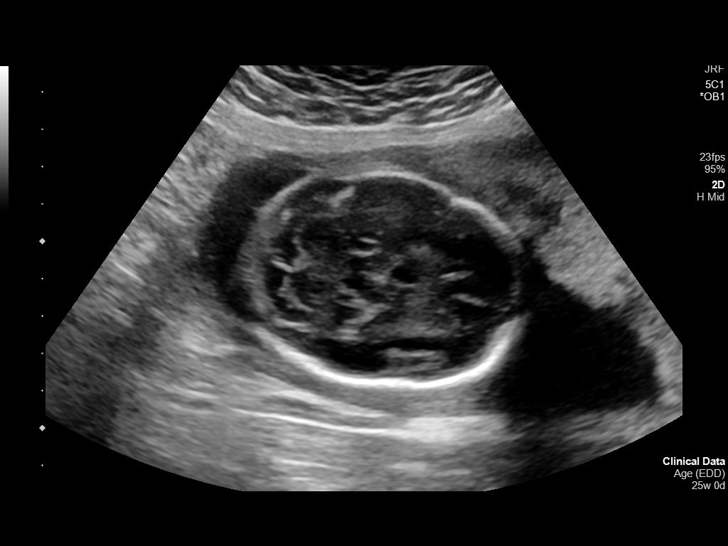
[im 52/101]
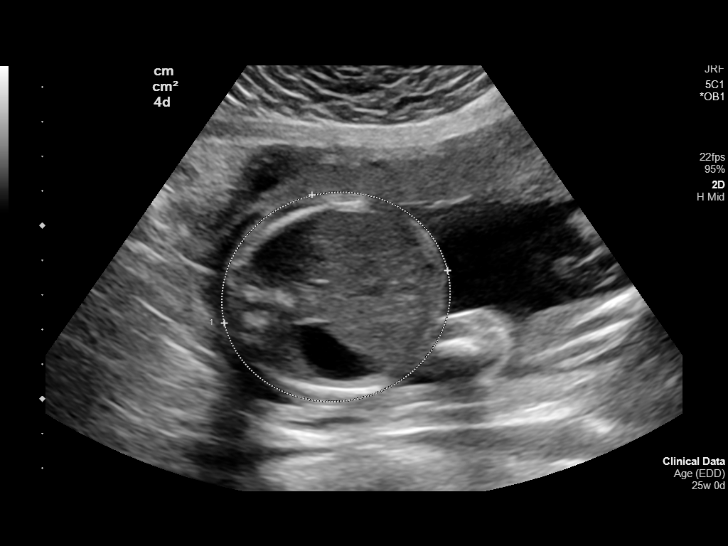
[im 60/101]
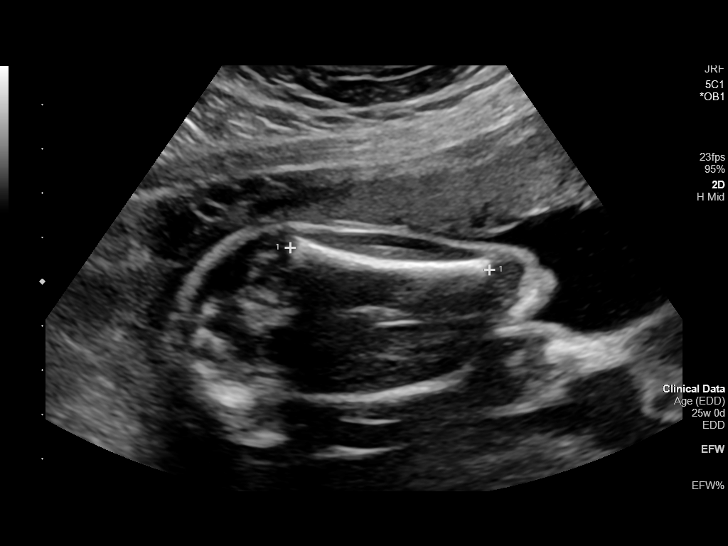
[im 67/101]
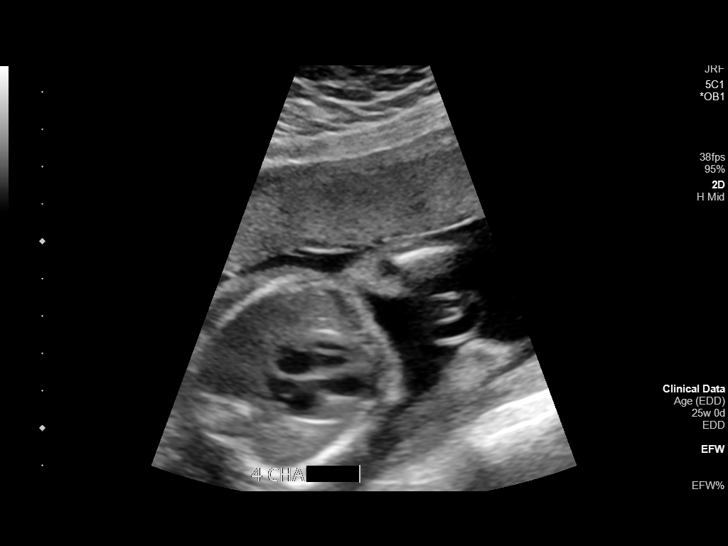
[im 75/101]
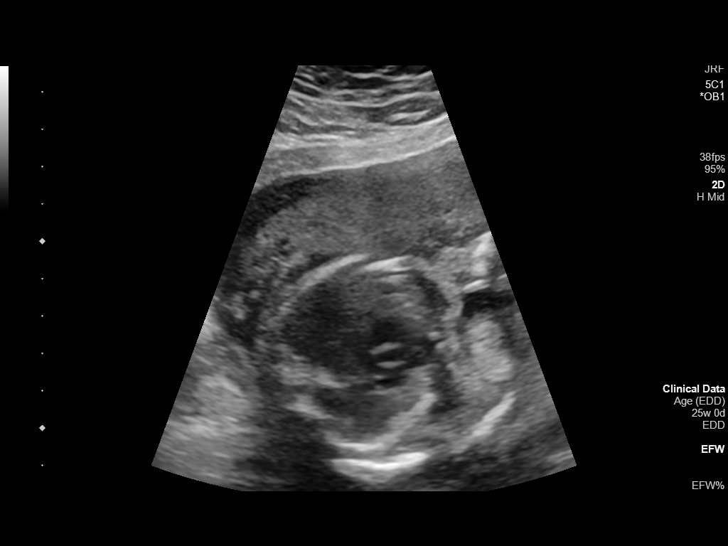
[im 82/101]
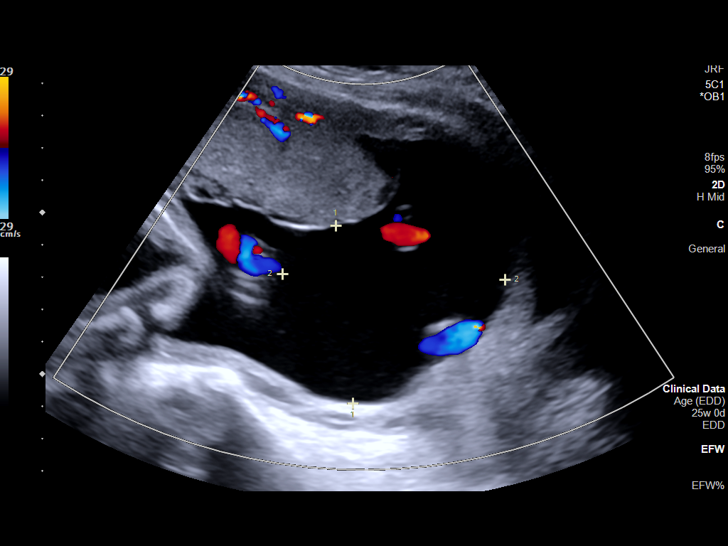
[im 89/101]
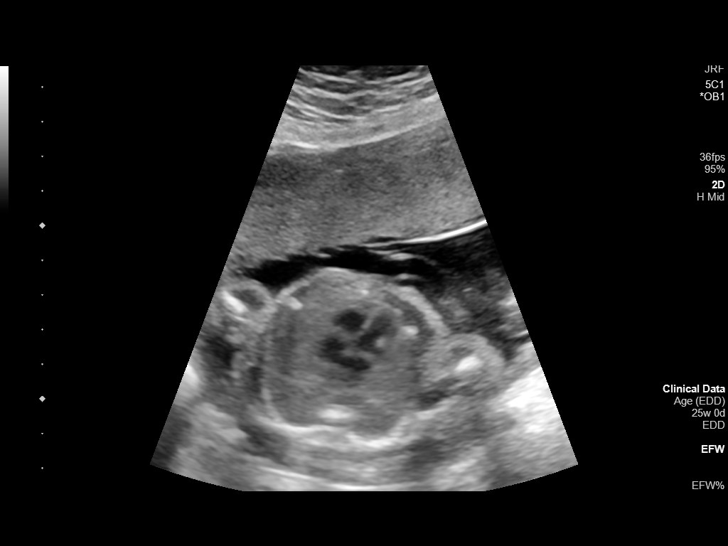
[im 97/101]
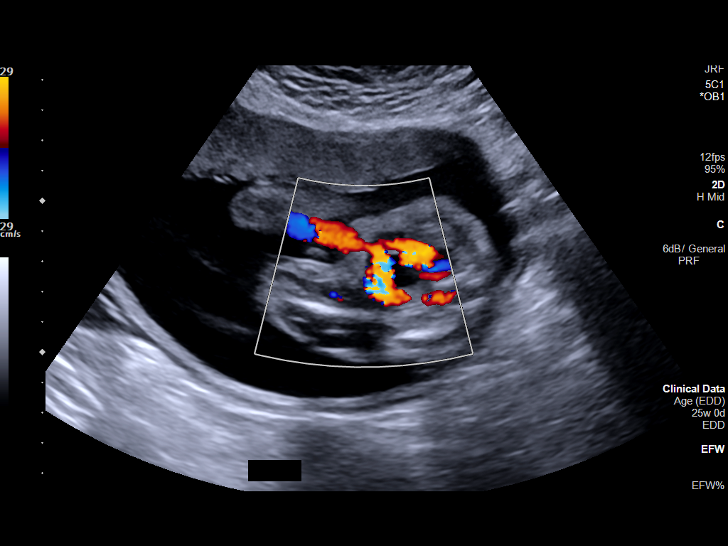

[13 of 28 positions shown; findings below may reference images not displayed]

FINDINGS: This case is available for my evaluation on today's date.

Number of Fetuses: 1

Heart Rate: Bpm

Movement: Present

Presentation: Variable

Previa: No

Placental Location: Anterior

Amniotic Fluid (Subjective): Normal

Amniotic Fluid (Objective):

Vertical pocket 5.5cm

FETAL BIOMETRY

BPD:  5.7cm 23w 4d

HC:    22.2cm 24w 2d

AC:    20.0cm 24w 4d

FL:    4.5cm 24w 6d

Current Mean GA: 24w 3d US EDC: 10/12/2018

Assigned GA: 25w 0d Assigned EDC: 10/08/2018

FETAL ANATOMY

Lateral Ventricles: Normal

Thalami/CSP:

Posterior Fossa: Normal

Nuchal Region: Not complete evaluated due to fetal position

Upper Lip: Normal

Spine: Not completely evaluated due to fetal position

4 Chamber Heart on Left: Normal

LVOT: Normal

RVOT: Normal

Stomach on Left: Normal

3 Vessel Cord: Normal

Cord Insertion site: Normal

Kidneys: Normal

Bladder: Normal

Extremities: Normal

Technical Limitations: Suboptimal evaluation of fetal spine and
nuchal region due to fetal position.

Maternal Findings:

Cervix:  4.2 cm and closed.
IMPRESSION: Single viable intrauterine pregnancy at 24 weeks 3 days. Variable
presentation. Fetal spine and nuchal region difficult to evaluate
due to fetal position.
